# Patient Record
Sex: Female | Born: 1975 | Race: Black or African American | Hispanic: No | Marital: Married | Smoking: Never smoker
Health system: Southern US, Community
[De-identification: ages and names within clinical notes are randomized; demographics above are authoritative.]

## PROBLEM LIST (undated history)

## (undated) ENCOUNTER — Emergency Department (HOSPITAL_COMMUNITY): Disposition: E | Payer: Self-pay | Attending: Family Medicine | Admitting: Family Medicine

## (undated) DIAGNOSIS — I10 Essential (primary) hypertension: Secondary | ICD-10-CM

## (undated) DIAGNOSIS — E785 Hyperlipidemia, unspecified: Secondary | ICD-10-CM

## (undated) DIAGNOSIS — Z789 Other specified health status: Secondary | ICD-10-CM

## (undated) DIAGNOSIS — E119 Type 2 diabetes mellitus without complications: Secondary | ICD-10-CM

## (undated) HISTORY — PX: WISDOM TOOTH EXTRACTION: SHX21

## (undated) HISTORY — DX: Hyperlipidemia, unspecified: E78.5

## (undated) HISTORY — DX: Essential (primary) hypertension: I10

## (undated) DEATH — deceased

---

## 2008-08-18 ENCOUNTER — Emergency Department (HOSPITAL_COMMUNITY): Admission: EM | Admit: 2008-08-18 | Discharge: 2008-08-18 | Payer: Self-pay | Admitting: Family Medicine

## 2008-11-20 ENCOUNTER — Ambulatory Visit (HOSPITAL_COMMUNITY): Admission: RE | Admit: 2008-11-20 | Discharge: 2008-11-20 | Payer: Self-pay | Admitting: Family Medicine

## 2009-02-13 ENCOUNTER — Encounter: Admission: RE | Admit: 2009-02-13 | Discharge: 2009-02-13 | Payer: Self-pay | Admitting: Pulmonary Disease

## 2009-03-28 ENCOUNTER — Inpatient Hospital Stay (HOSPITAL_COMMUNITY): Admission: AD | Admit: 2009-03-28 | Discharge: 2009-03-30 | Payer: Self-pay | Admitting: Family Medicine

## 2009-03-28 ENCOUNTER — Ambulatory Visit: Payer: Self-pay | Admitting: Physician Assistant

## 2010-10-19 ENCOUNTER — Emergency Department (HOSPITAL_COMMUNITY)
Admission: EM | Admit: 2010-10-19 | Discharge: 2010-10-19 | Payer: Self-pay | Source: Home / Self Care | Admitting: Emergency Medicine

## 2010-10-22 ENCOUNTER — Ambulatory Visit (HOSPITAL_COMMUNITY): Admission: RE | Admit: 2010-10-22 | Discharge: 2010-10-22 | Payer: Self-pay | Admitting: Family Medicine

## 2011-01-13 ENCOUNTER — Emergency Department (HOSPITAL_COMMUNITY)
Admission: EM | Admit: 2011-01-13 | Discharge: 2011-01-13 | Disposition: A | Discharge status: Home / Self Care | Payer: Medicaid Other | Attending: Emergency Medicine | Admitting: Emergency Medicine

## 2011-01-13 DIAGNOSIS — R109 Unspecified abdominal pain: Secondary | ICD-10-CM | POA: Insufficient documentation

## 2011-01-13 DIAGNOSIS — N760 Acute vaginitis: Secondary | ICD-10-CM | POA: Insufficient documentation

## 2011-01-13 DIAGNOSIS — R1110 Vomiting, unspecified: Secondary | ICD-10-CM | POA: Insufficient documentation

## 2011-01-13 DIAGNOSIS — A499 Bacterial infection, unspecified: Secondary | ICD-10-CM | POA: Insufficient documentation

## 2011-01-13 DIAGNOSIS — B9689 Other specified bacterial agents as the cause of diseases classified elsewhere: Secondary | ICD-10-CM | POA: Insufficient documentation

## 2011-01-13 DIAGNOSIS — R10819 Abdominal tenderness, unspecified site: Secondary | ICD-10-CM | POA: Insufficient documentation

## 2011-01-13 DIAGNOSIS — R42 Dizziness and giddiness: Secondary | ICD-10-CM | POA: Insufficient documentation

## 2011-01-13 DIAGNOSIS — R51 Headache: Secondary | ICD-10-CM | POA: Insufficient documentation

## 2011-01-13 DIAGNOSIS — J3489 Other specified disorders of nose and nasal sinuses: Secondary | ICD-10-CM | POA: Insufficient documentation

## 2011-01-13 LAB — DIFFERENTIAL
Basophils Relative: 0 % (ref 0–1)
Eosinophils Absolute: 0.1 10*3/uL (ref 0.0–0.7)
Eosinophils Relative: 2 % (ref 0–5)
Neutrophils Relative %: 51 % (ref 43–77)

## 2011-01-13 LAB — WET PREP, GENITAL
Trich, Wet Prep: NONE SEEN
Yeast Wet Prep HPF POC: NONE SEEN

## 2011-01-13 LAB — POCT I-STAT, CHEM 8
Calcium, Ion: 1.34 mmol/L — ABNORMAL HIGH (ref 1.12–1.32)
Glucose, Bld: 92 mg/dL (ref 70–99)
HCT: 37 % (ref 36.0–46.0)
Hemoglobin: 12.6 g/dL (ref 12.0–15.0)
TCO2: 23 mmol/L (ref 0–100)

## 2011-01-13 LAB — CBC
MCV: 77.3 fL — ABNORMAL LOW (ref 78.0–100.0)
Platelets: 222 10*3/uL (ref 150–400)
RBC: 4.53 MIL/uL (ref 3.87–5.11)
RDW: 14.7 % (ref 11.5–15.5)
WBC: 4.7 10*3/uL (ref 4.0–10.5)

## 2011-01-13 LAB — URINALYSIS, ROUTINE W REFLEX MICROSCOPIC
Bilirubin Urine: NEGATIVE
Hgb urine dipstick: NEGATIVE
Nitrite: NEGATIVE
Protein, ur: NEGATIVE mg/dL
Specific Gravity, Urine: 1.011 (ref 1.005–1.030)
Urobilinogen, UA: 0.2 mg/dL (ref 0.0–1.0)

## 2011-01-13 LAB — POCT PREGNANCY, URINE: Preg Test, Ur: NEGATIVE

## 2011-01-14 LAB — GC/CHLAMYDIA PROBE AMP, GENITAL
Chlamydia, DNA Probe: NEGATIVE
GC Probe Amp, Genital: NEGATIVE

## 2011-01-14 IMAGING — CR DG CHEST 1V
1 series · 1 of 1 positions shown · non-contrast
Comparison: None

CLINICAL DATA: Positive PPD.  Pregnant.

CHEST - 1 VIEW

[view not recorded]
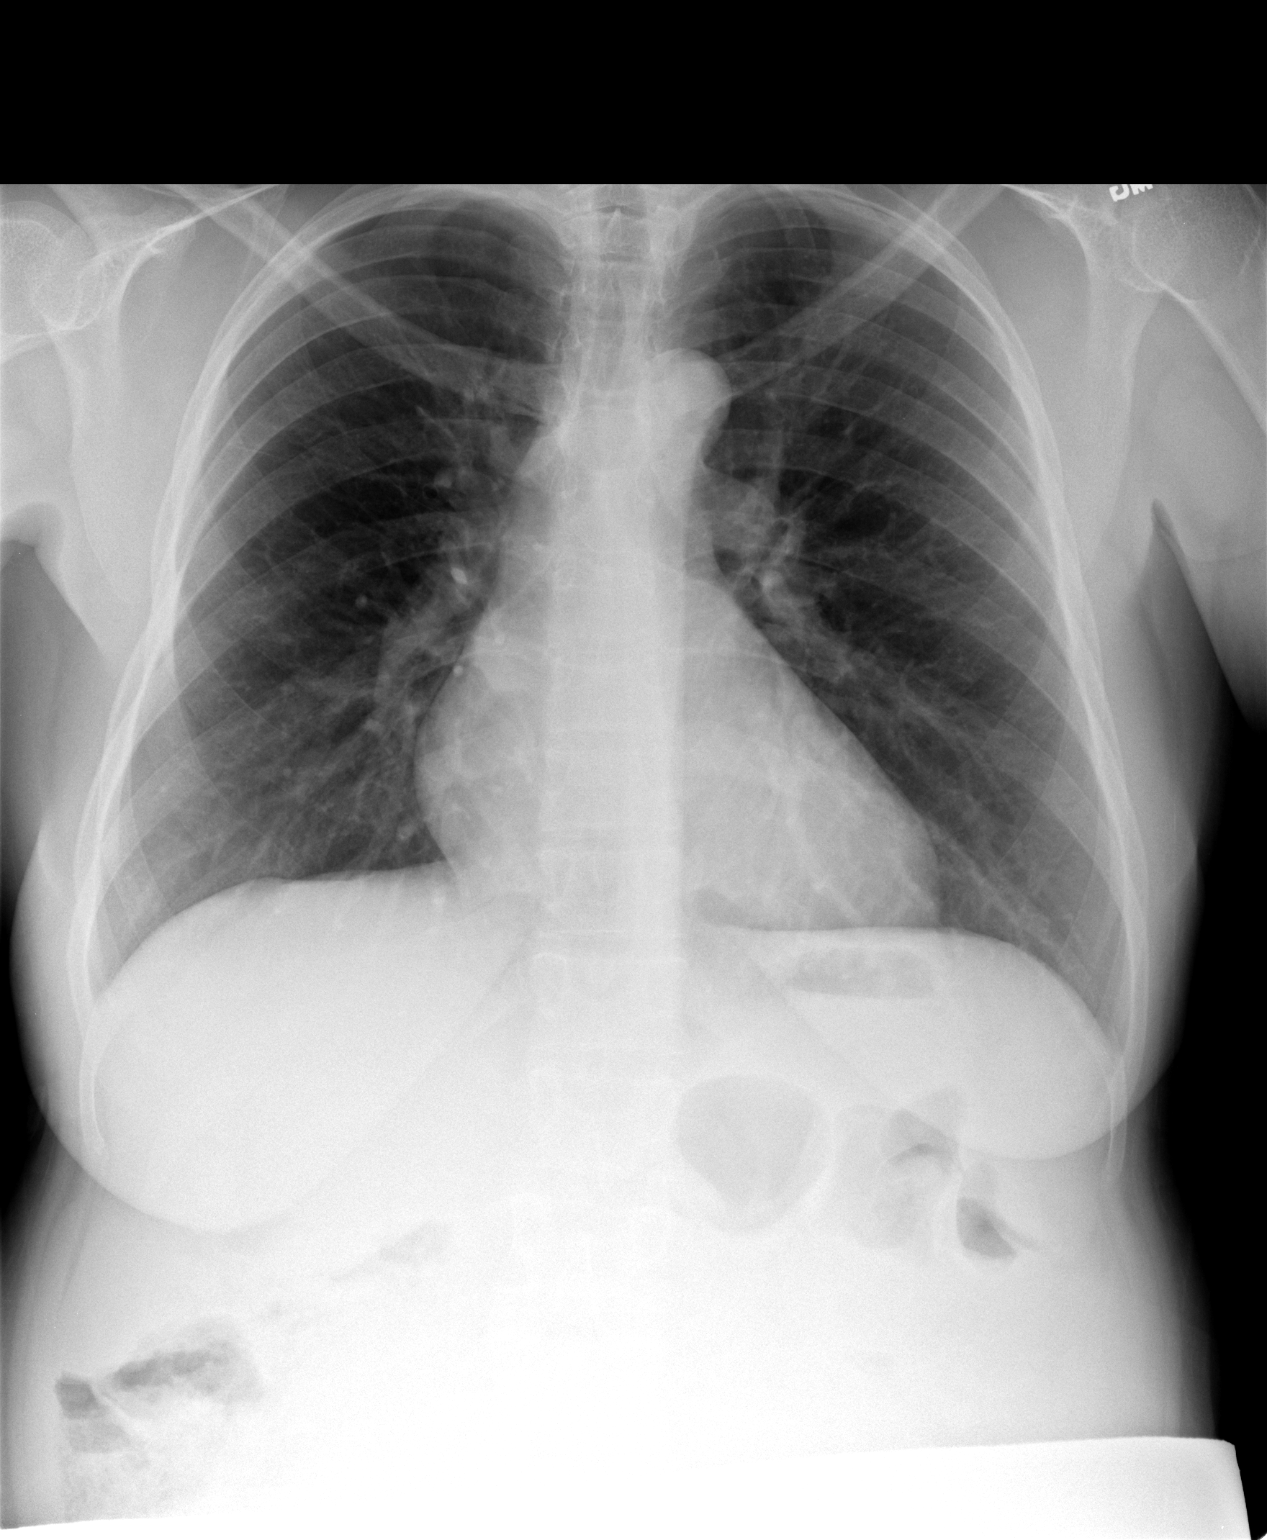

[1 of 1 positions shown; findings below may reference images not displayed]

FINDINGS: The heart size and mediastinal contours are normal.  The
lungs are clear and there is no pleural effusion or pneumothorax.
No acute osseous abnormalities are apparent.  There is a left
cervical rib.
IMPRESSION: 1.  No radiographic evidence of tuberculosis or other acute chest
process.
2.  Left cervical rib - potentially symptomatic.  Correlate
clinically.

## 2011-03-12 LAB — CBC
Hemoglobin: 11.8 g/dL — ABNORMAL LOW (ref 12.0–15.0)
RBC: 3.97 MIL/uL (ref 3.87–5.11)
WBC: 9 10*3/uL (ref 4.0–10.5)

## 2011-03-12 LAB — RPR: RPR Ser Ql: NONREACTIVE

## 2011-03-12 LAB — COMPREHENSIVE METABOLIC PANEL
ALT: 10 U/L (ref 0–35)
AST: 17 U/L (ref 0–37)
Alkaline Phosphatase: 241 U/L — ABNORMAL HIGH (ref 39–117)
CO2: 23 mEq/L (ref 19–32)
Chloride: 107 mEq/L (ref 96–112)
GFR calc Af Amer: >60 mL/min (ref >60)
GFR calc non Af Amer: >60 mL/min (ref >60)
Glucose, Bld: 81 mg/dL (ref 70–99)
Sodium: 135 mEq/L (ref 135–145)
Total Bilirubin: 0.5 mg/dL (ref 0.3–1.2)

## 2011-03-12 LAB — URINALYSIS, DIPSTICK ONLY
Nitrite: NEGATIVE
Protein, ur: NEGATIVE mg/dL
Specific Gravity, Urine: 1.015 (ref 1.005–1.030)
Urobilinogen, UA: 0.2 mg/dL (ref 0.0–1.0)

## 2011-09-01 LAB — POCT URINALYSIS DIP (DEVICE)
Hgb urine dipstick: NEGATIVE
Nitrite: NEGATIVE
Protein, ur: NEGATIVE
pH: 6

## 2011-09-01 LAB — POCT PREGNANCY, URINE: Preg Test, Ur: POSITIVE

## 2011-10-30 ENCOUNTER — Inpatient Hospital Stay (HOSPITAL_COMMUNITY): Payer: Medicaid Other

## 2011-10-30 ENCOUNTER — Inpatient Hospital Stay (HOSPITAL_COMMUNITY)
Admission: AD | Admit: 2011-10-30 | Discharge: 2011-10-30 | Disposition: A | Discharge status: Home / Self Care | Payer: Medicaid Other | Source: Ambulatory Visit | Attending: Obstetrics & Gynecology | Admitting: Obstetrics & Gynecology

## 2011-10-30 ENCOUNTER — Encounter (HOSPITAL_COMMUNITY): Payer: Self-pay | Admitting: *Deleted

## 2011-10-30 DIAGNOSIS — R1032 Left lower quadrant pain: Secondary | ICD-10-CM

## 2011-10-30 DIAGNOSIS — L293 Anogenital pruritus, unspecified: Secondary | ICD-10-CM | POA: Insufficient documentation

## 2011-10-30 DIAGNOSIS — B373 Candidiasis of vulva and vagina: Secondary | ICD-10-CM

## 2011-10-30 DIAGNOSIS — B3731 Acute candidiasis of vulva and vagina: Secondary | ICD-10-CM

## 2011-10-30 DIAGNOSIS — R109 Unspecified abdominal pain: Secondary | ICD-10-CM

## 2011-10-30 HISTORY — DX: Other specified health status: Z78.9

## 2011-10-30 LAB — URINALYSIS, ROUTINE W REFLEX MICROSCOPIC
Ketones, ur: NEGATIVE mg/dL
Nitrite: NEGATIVE
Protein, ur: NEGATIVE mg/dL
pH: 5.5 (ref 5.0–8.0)

## 2011-10-30 LAB — WET PREP, GENITAL: Clue Cells Wet Prep HPF POC: NONE SEEN

## 2011-10-30 LAB — CBC
MCV: 70.9 fL — ABNORMAL LOW (ref 78.0–100.0)
Platelets: 211 10*3/uL (ref 150–400)
RDW: 17.6 % — ABNORMAL HIGH (ref 11.5–15.5)
WBC: 5 10*3/uL (ref 4.0–10.5)

## 2011-10-30 LAB — URINE MICROSCOPIC-ADD ON

## 2011-10-30 MED ORDER — FLUCONAZOLE 150 MG PO TABS
150.0000 mg | ORAL_TABLET | Freq: Once | ORAL | Status: AC
  Administered 2011-10-30: 150 mg via ORAL
  Filled 2011-10-30: qty 1

## 2011-10-30 MED ORDER — KETOROLAC TROMETHAMINE 60 MG/2ML IM SOLN
60.0000 mg | Freq: Once | INTRAMUSCULAR | Status: AC
  Administered 2011-10-30: 60 mg via INTRAMUSCULAR
  Filled 2011-10-30: qty 2

## 2011-10-30 MED ORDER — FLUCONAZOLE 150 MG PO TABS: 150.0000 mg | ORAL_TABLET | Freq: Once | ORAL | Status: AC

## 2011-10-30 NOTE — Progress Notes (Signed)
Patient states she is having a black discharge with an itch.

## 2011-10-31 LAB — GC/CHLAMYDIA PROBE AMP, GENITAL: Chlamydia, DNA Probe: NEGATIVE

## 2011-12-05 ENCOUNTER — Encounter: Payer: Self-pay | Admitting: *Deleted

## 2011-12-05 ENCOUNTER — Emergency Department (INDEPENDENT_AMBULATORY_CARE_PROVIDER_SITE_OTHER)
Admission: EM | Admit: 2011-12-05 | Discharge: 2011-12-05 | Disposition: A | Discharge status: Home / Self Care | Payer: Medicaid Other | Source: Home / Self Care | Attending: Family Medicine | Admitting: Family Medicine

## 2011-12-05 DIAGNOSIS — J020 Streptococcal pharyngitis: Secondary | ICD-10-CM

## 2011-12-05 MED ORDER — AMOXICILLIN 500 MG PO CAPS: 500.0000 mg | ORAL_CAPSULE | Freq: Three times a day (TID) | ORAL | Status: AC

## 2011-12-05 NOTE — ED Notes (Signed)
Pt c/o sorethroat for 3 days with headache.  Obvious distress noted with swallowing.  Throat red and swollen.

## 2011-12-05 NOTE — ED Notes (Signed)
Pt speaks limited [place], son is here helping translate.

## 2011-12-05 NOTE — ED Provider Notes (Signed)
History     CSN: [id number]  Arrival date & time 12/05/11  9563   First MD Initiated Contact with Patient 12/05/11 [phone]      Chief Complaint  Patient presents with  . Sore Throat    (Consider location/radiation/quality/duration/timing/severity/associated sxs/prior treatment) Patient is a 36 y.o. female presenting with pharyngitis. The history is provided by the patient and a relative. The history is limited by a language barrier. Language Interpreter Used: son translated very well.  Sore Throat This is a new problem. The current episode started more than 2 days ago. The problem occurs constantly. The problem has been gradually worsening. The symptoms are aggravated by swallowing.    History reviewed. No pertinent past medical history.  History reviewed. No pertinent past surgical history.  No family history on file.  History  Substance Use Topics  . Smoking status: Never Smoker   . Smokeless tobacco: Not on file  . Alcohol Use: No    OB History    Grav Para Term Preterm Abortions TAB SAB Ect Mult Living                  Review of Systems  Constitutional: Negative.   HENT: Positive for sore throat and trouble swallowing.   Respiratory: Negative.   Gastrointestinal: Negative.   Musculoskeletal: Positive for myalgias.  Skin: Negative.     Allergies  Review of patient's allergies indicates no known allergies.  Home Medications   Current Outpatient Rx  Name Route Sig Dispense Refill  . ACETAMINOPHEN 325 MG PO TABS Oral Take 650 mg by mouth every 6 (six) hours as needed.      . AMOXICILLIN 500 MG PO CAPS Oral Take 1 capsule (500 mg total) by mouth 3 (three) times daily. 30 capsule 0    BP 122/76  Pulse 91  Temp(Src) 99.5 F (37.5 C) (Oral)  Resp 20  SpO2 100%  LMP 12/02/2011  Physical Exam  Nursing note and vitals reviewed. Constitutional: She appears well-developed and well-nourished. She appears distressed.  HENT:  Head: Normocephalic.  Right Ear:  External ear normal.  Left Ear: External ear normal.  Mouth/Throat: Mucous membranes are normal. Oropharyngeal exudate and posterior oropharyngeal erythema present. No tonsillar abscesses.    ED Course  Procedures (including critical care time)  Labs Reviewed  POCT RAPID STREP A ([facility] C[facility] ONLY) - Abnormal; Notable for the following:    Streptococcus, Group A Screen (Direct) POSITIVE (*)    All other components within normal limits   No results found.   1. Streptococcal sore throat       MDM  Strep--  Pos.        Barkley Bruns, MD 12/05/11 [phone]

## 2011-12-05 NOTE — ED Provider Notes (Addendum)
History     CSN: [id number]  Arrival date & time      First MD Initiated Contact with Patient 12/05/11 0824      No chief complaint on file.   (Consider location/radiation/quality/duration/timing/severity/associated sxs/prior treatment) HPI  Past Medical History  Diagnosis Date  . No pertinent past medical history     Past Surgical History  Procedure Date  . Wisdom tooth extraction     Family History  Problem Relation Age of Onset  . Anesthesia problems Neg Hx     History  Substance Use Topics  . Smoking status: Never Smoker   . Smokeless tobacco: Never Used  . Alcohol Use: No    OB History    Grav Para Term Preterm Abortions TAB SAB Ect Mult Living   5 5 4 1  0 0 0 0 0 4      Review of Systems  Allergies  Review of patient's allergies indicates no known allergies.  Home Medications  No current outpatient prescriptions on file.  There were no vitals taken for this visit.  Physical Exam  ED Course  Procedures (including critical care time)  Labs Reviewed - No data to display No results found.   No diagnosis found.    MDM  Strep  pos        Barkley Bruns, MD 12/05/11 1143  Barkley Bruns, MD 01/16/12 [phone]

## 2012-02-04 ENCOUNTER — Encounter (HOSPITAL_COMMUNITY): Payer: Self-pay | Admitting: *Deleted

## 2012-09-19 ENCOUNTER — Emergency Department (HOSPITAL_COMMUNITY)
Admission: EM | Admit: 2012-09-19 | Discharge: 2012-09-20 | Disposition: A | Discharge status: Home / Self Care | Payer: Medicaid Other | Attending: Emergency Medicine | Admitting: Emergency Medicine

## 2012-09-19 ENCOUNTER — Encounter (HOSPITAL_COMMUNITY): Payer: Self-pay | Admitting: Emergency Medicine

## 2012-09-19 ENCOUNTER — Encounter (HOSPITAL_COMMUNITY): Payer: Self-pay | Admitting: Nurse Practitioner

## 2012-09-19 ENCOUNTER — Emergency Department (INDEPENDENT_AMBULATORY_CARE_PROVIDER_SITE_OTHER)
Admission: EM | Admit: 2012-09-19 | Discharge: 2012-09-19 | Disposition: A | Discharge status: Nursing Home (Includes ICF) | Payer: Medicaid Other | Source: Home / Self Care | Attending: Family Medicine | Admitting: Family Medicine

## 2012-09-19 DIAGNOSIS — R109 Unspecified abdominal pain: Secondary | ICD-10-CM

## 2012-09-19 DIAGNOSIS — I498 Other specified cardiac arrhythmias: Secondary | ICD-10-CM | POA: Insufficient documentation

## 2012-09-19 DIAGNOSIS — M549 Dorsalgia, unspecified: Secondary | ICD-10-CM | POA: Insufficient documentation

## 2012-09-19 DIAGNOSIS — R51 Headache: Secondary | ICD-10-CM | POA: Insufficient documentation

## 2012-09-19 DIAGNOSIS — R112 Nausea with vomiting, unspecified: Secondary | ICD-10-CM | POA: Insufficient documentation

## 2012-09-19 DIAGNOSIS — R6883 Chills (without fever): Secondary | ICD-10-CM | POA: Insufficient documentation

## 2012-09-19 LAB — COMPREHENSIVE METABOLIC PANEL
BUN: 12 mg/dL (ref 6–23)
Calcium: 11 mg/dL — ABNORMAL HIGH (ref 8.4–10.5)
Creatinine, Ser: 0.83 mg/dL (ref 0.50–1.10)
GFR calc Af Amer: >90 mL/min (ref >90)
GFR calc non Af Amer: >90 mL/min (ref >90)
Glucose, Bld: 82 mg/dL (ref 70–99)
Sodium: 137 mEq/L (ref 135–145)
Total Protein: 8.4 g/dL — ABNORMAL HIGH (ref 6.0–8.3)

## 2012-09-19 LAB — URINALYSIS, ROUTINE W REFLEX MICROSCOPIC
Leukocytes, UA: NEGATIVE
Nitrite: NEGATIVE
Specific Gravity, Urine: 1.023 (ref 1.005–1.030)
Urobilinogen, UA: 1 mg/dL (ref 0.0–1.0)
pH: 6 (ref 5.0–8.0)

## 2012-09-19 LAB — CBC WITH DIFFERENTIAL/PLATELET
Basophils Relative: 1 % (ref 0–1)
Eosinophils Absolute: 0 10*3/uL (ref 0.0–0.7)
Hemoglobin: 10.3 g/dL — ABNORMAL LOW (ref 12.0–15.0)
Lymphs Abs: 2 10*3/uL (ref 0.7–4.0)
MCH: 20.1 pg — ABNORMAL LOW (ref 26.0–34.0)
MCHC: 30 g/dL (ref 30.0–36.0)
MCV: 66.9 fL — ABNORMAL LOW (ref 78.0–100.0)
Monocytes Absolute: 0.6 10*3/uL (ref 0.1–1.0)
Neutro Abs: 1.7 10*3/uL (ref 1.7–7.7)
Neutrophils Relative %: 39 % — ABNORMAL LOW (ref 43–77)

## 2012-09-19 LAB — POCT URINALYSIS DIP (DEVICE)
Hgb urine dipstick: NEGATIVE
Ketones, ur: NEGATIVE mg/dL
Leukocytes, UA: NEGATIVE
Protein, ur: NEGATIVE mg/dL
pH: 6.5 (ref 5.0–8.0)

## 2012-09-19 MED ORDER — IOHEXOL 300 MG/ML  SOLN
20.0000 mL | INTRAMUSCULAR | Status: AC
  Administered 2012-09-19: 20 mL via ORAL

## 2012-09-19 MED ORDER — SODIUM CHLORIDE 0.9 % IV SOLN
INTRAVENOUS | Status: DC
  Administered 2012-09-19: 125 mL/h via INTRAVENOUS

## 2012-09-19 MED ORDER — KETOROLAC TROMETHAMINE 30 MG/ML IJ SOLN
30.0000 mg | Freq: Once | INTRAMUSCULAR | Status: AC
  Administered 2012-09-19: 30 mg via INTRAVENOUS
  Filled 2012-09-19: qty 1

## 2012-09-19 NOTE — ED Notes (Signed)
Pts heart rate is high 40's low 50's. Pt asymptomatic. RN asked patient if she usually has a low heart rate and pt states she does.

## 2012-09-19 NOTE — ED Provider Notes (Signed)
History     CSN: [id number]  Arrival date & time 09/19/12  1414   First MD Initiated Contact with Patient 09/19/12 1820      Chief Complaint  Patient presents with  . Abdominal Pain    (Consider location/radiation/quality/duration/timing/severity/associated sxs/prior treatment) Patient is a 36 y.o. female presenting with abdominal pain. The history is provided by the patient and the spouse.  Abdominal Pain The primary symptoms of the illness include abdominal pain, nausea and vomiting. The primary symptoms of the illness do not include fever, shortness of breath, diarrhea or dysuria.  Additional symptoms associated with the illness include chills and back pain. Symptoms associated with the illness do not include diaphoresis or hematuria.   36 year old, female, with no significant past medical history presents to emergency department complaining of nausea and vomiting.  X4 yesterday, along with abdominal pain, and back pain.  She also is lightheaded, and has a headache.  She denies fevers, chills, sweating.  She denies cough, or shortness of breath.  She denies diarrhea.  She denies dysuria, or hematuria.  She has not been exposed to anybody with similar symptoms.  Past Medical History  Diagnosis Date  . No pertinent past medical history     Past Surgical History  Procedure Date  . Wisdom tooth extraction     Family History  Problem Relation Age of Onset  . Anesthesia problems Neg Hx     History  Substance Use Topics  . Smoking status: Never Smoker   . Smokeless tobacco: Not on file  . Alcohol Use: No    OB History    Grav Para Term Preterm Abortions TAB SAB Ect Mult Living   5 5 4 1  0 0 0 0 0 4      Review of Systems  Constitutional: Positive for chills. Negative for fever and diaphoresis.  Respiratory: Negative for cough and shortness of breath.   Cardiovascular: Negative for chest pain.  Gastrointestinal: Positive for nausea, vomiting and abdominal pain.  Negative for diarrhea.  Genitourinary: Negative for dysuria and hematuria.  Musculoskeletal: Positive for back pain.  Skin: Negative for rash.  Neurological: Positive for headaches.  Psychiatric/Behavioral: Negative for confusion.  All other systems reviewed and are negative.    Allergies  Review of patient's allergies indicates no known allergies.  Home Medications  No current outpatient prescriptions on file.  BP 112/66  Pulse 49  Temp 98.7 F (37.1 C) (Oral)  Resp 16  SpO2 98%  LMP 09/12/2012  Physical Exam  Nursing note and vitals reviewed. Constitutional: She is oriented to person, place, and time. She appears well-developed and well-nourished. No distress.  HENT:  Head: Normocephalic and atraumatic.  Mouth/Throat: Oropharynx is clear and moist. No oropharyngeal exudate.  Eyes: Conjunctivae normal and EOM are normal.  Neck: Normal range of motion. Neck supple.  Cardiovascular: Regular rhythm and intact distal pulses.   No murmur heard.      bradycardia  Pulmonary/Chest: Effort normal and breath sounds normal. No respiratory distress.  Abdominal: Soft. Bowel sounds are normal. She exhibits no distension. There is tenderness.       Left abd ttp with no peritoneal signs  Musculoskeletal: Normal range of motion. She exhibits no edema.  Neurological: She is alert and oriented to person, place, and time. No cranial nerve deficit.  Skin: Skin is warm and dry.  Psychiatric: She has a normal mood and affect. Thought content normal.    ED Course  Procedures (including critical care time)  Labs  Reviewed  CBC WITH DIFFERENTIAL - Abnormal; Notable for the following:    RBC 5.13 (*)     Hemoglobin 10.3 (*)     HCT 34.3 (*)     MCV 66.9 (*)     MCH 20.1 (*)     RDW 18.4 (*)     Neutrophils Relative 39 (*)     Monocytes Relative 13 (*)     All other components within normal limits  COMPREHENSIVE METABOLIC PANEL - Abnormal; Notable for the following:    Calcium 11.0 (*)      Total Protein 8.4 (*)     Total Bilirubin 0.2 (*)     All other components within normal limits  URINALYSIS, ROUTINE W REFLEX MICROSCOPIC - Abnormal; Notable for the following:    APPearance HAZY (*)     All other components within normal limits   No results found.   No diagnosis found.  ECG Sinus bradycardia at 54 beats per minute. Normal axis. Normal intervals. Nonspecific T-wave changes  MDM  abd pain with chills        Cheri Guppy, MD 09/19/12 1931

## 2012-09-19 NOTE — ED Notes (Signed)
Pt reports yesterday she began feeling cold then got a headache and lower back pain. Then pt vomited X 4 yesterday, no emesis today. Pt denies nausea, SOB and CP. Pt c/o generalized weakness and LLQ tenderness. Pt reports LMP 10/13 and LBM this morning, pt reports it was normal. Pt denies diarrhea.

## 2012-09-19 NOTE — ED Notes (Signed)
Pt ambulated to restroom, steady gait noted. Urine sample obtained. Pt placed back on monitor, side rail up, family at bedside

## 2012-09-19 NOTE — ED Notes (Signed)
Sent from [facility] for further eval abd pain , low back pain, chills, headache since yesterday

## 2012-09-19 NOTE — ED Notes (Signed)
Pt slowing drink CT contrast, informed to finished drinking and let RN know when she is finished.

## 2012-09-19 NOTE — ED Notes (Signed)
Pt and family updated on wait. No needs at this time.

## 2012-09-19 NOTE — ED Provider Notes (Signed)
History     CSN: [id number]  Arrival date & time 09/19/12  1129   None     Chief Complaint  Patient presents with  . Headache  . Back Pain    (Consider location/radiation/quality/duration/timing/severity/associated sxs/prior treatment) Patient is a 36 y.o. female presenting with headaches and back pain. The history is provided by the patient and a friend. The history is limited by a language barrier. A language interpreter was used.  Headache The primary symptoms include headaches, dizziness, nausea and vomiting.  Dizziness also occurs with nausea and vomiting.   Back Pain  Associated symptoms include headaches.  Poor historian.  Low back pain constant associated with chills that began yesterday.  Sudden onset, yesterday 4 episodes of vomiting.  Denies urinary symptoms, no vaginal discharge.  Patient's last menstrual period was 09/12/2012.  Past Medical History  Diagnosis Date  . No pertinent past medical history     Past Surgical History  Procedure Date  . Wisdom tooth extraction     Family History  Problem Relation Age of Onset  . Anesthesia problems Neg Hx     History  Substance Use Topics  . Smoking status: Never Smoker   . Smokeless tobacco: Not on file  . Alcohol Use: No    OB History    Grav Para Term Preterm Abortions TAB SAB Ect Mult Living   5 5 4 1  0 0 0 0 0 4      Review of Systems  Constitutional: Positive for chills and fatigue.  Respiratory: Negative.   Cardiovascular: Negative.   Gastrointestinal: Positive for nausea and vomiting.  Genitourinary: Negative.   Musculoskeletal: Positive for back pain.  Neurological: Positive for dizziness and headaches. Negative for syncope and speech difficulty.    Allergies  Review of patient's allergies indicates no known allergies.  Home Medications   Current Outpatient Rx  Name Route Sig Dispense Refill  . ACETAMINOPHEN 325 MG PO TABS Oral Take 650 mg by mouth every 6 (six) hours as needed.          BP 116/68  Pulse 55  Temp 98.2 F (36.8 C) (Oral)  Resp 17  SpO2 100%  LMP 09/12/2012  Physical Exam  Nursing note and vitals reviewed. Constitutional: She is oriented to person, place, and time. Vital signs are normal. She appears well-developed and well-nourished. She is active and cooperative.  HENT:  Head: Normocephalic.  Right Ear: External ear normal.  Left Ear: External ear normal.  Nose: Nose normal.  Mouth/Throat: Oropharynx is clear and moist. No oropharyngeal exudate.  Eyes: Conjunctivae normal and EOM are normal. Pupils are equal, round, and reactive to light. No scleral icterus.  Neck: Trachea normal and normal range of motion. Neck supple.  Cardiovascular: Normal rate, regular rhythm, normal heart sounds and intact distal pulses.   No murmur heard. Pulmonary/Chest: Effort normal and breath sounds normal.  Abdominal: Soft. Bowel sounds are normal. There is tenderness in the left lower quadrant. There is CVA tenderness. There is no rebound.  Musculoskeletal: Normal range of motion.  Lymphadenopathy:    She has no cervical adenopathy.  Neurological: She is alert and oriented to person, place, and time. She has normal strength. No cranial nerve deficit or sensory deficit. Coordination and gait normal. GCS eye subscore is 4. GCS verbal subscore is 5. GCS motor subscore is 6.  Skin: Skin is warm and dry.  Psychiatric: She has a normal mood and affect. Her speech is normal and behavior is normal. Judgment and  thought content normal. Cognition and memory are normal.    ED Course  Procedures (including critical care time)   Labs Reviewed  POCT URINALYSIS DIP (DEVICE)  POCT PREGNANCY, URINE   No results found. Urine preg-negative Urinalysis wnl  1. Abdominal  pain, other specified site       MDM  Transfer to [facility] for further evaluation and management of abdominal pain.        Johnsie Kindred, NP 09/19/12 1355

## 2012-09-19 NOTE — ED Provider Notes (Signed)
Medical screening examination/treatment/procedure(s) were performed by resident physician or non-physician practitioner and as supervising physician I was immediately available for consultation/collaboration.   Jerrol Helmers DOUGLAS MD.    Nakeesha Bowler D Tanishia Lemaster, MD 09/19/12 1457 

## 2012-09-19 NOTE — ED Notes (Addendum)
Pt c/o headache and dizziness since yesterday and she states that she vomited 4 times and is having cold chills, fatigue.  pt denies diarrhea and fever.  Pt is also c/o lower back pain but denies urinary symptoms.

## 2012-09-20 ENCOUNTER — Emergency Department (HOSPITAL_COMMUNITY): Payer: Medicaid Other

## 2012-09-20 ENCOUNTER — Encounter (HOSPITAL_COMMUNITY): Payer: Self-pay | Admitting: Radiology

## 2012-09-20 MED ORDER — PROMETHAZINE HCL 25 MG PO TABS: 25.0000 mg | ORAL_TABLET | Freq: Four times a day (QID) | ORAL | Status: DC | PRN

## 2012-09-20 MED ORDER — OXYCODONE-ACETAMINOPHEN 5-325 MG PO TABS: 2.0000 | ORAL_TABLET | ORAL | Status: DC | PRN

## 2012-09-20 MED ORDER — IOHEXOL 300 MG/ML  SOLN
100.0000 mL | Freq: Once | INTRAMUSCULAR | Status: AC | PRN
  Administered 2012-09-20: 100 mL via INTRAVENOUS

## 2013-09-29 IMAGING — US US PELVIS COMPLETE
1 series · 14 of 25 positions shown · non-contrast
Comparison: None.

CLINICAL DATA: Right adnexal pain

TRANSABDOMINAL AND TRANSVAGINAL ULTRASOUND OF PELVIS
TECHNIQUE: Both transabdominal and transvaginal ultrasound
examinations of the pelvis were performed. Transabdominal technique
was performed for global imaging of the pelvis including uterus,
ovaries, adnexal regions, and pelvic cul-de-sac.

[Series 1: us pelvis complete · 14 of 55 slices shown]
[im 1/55]
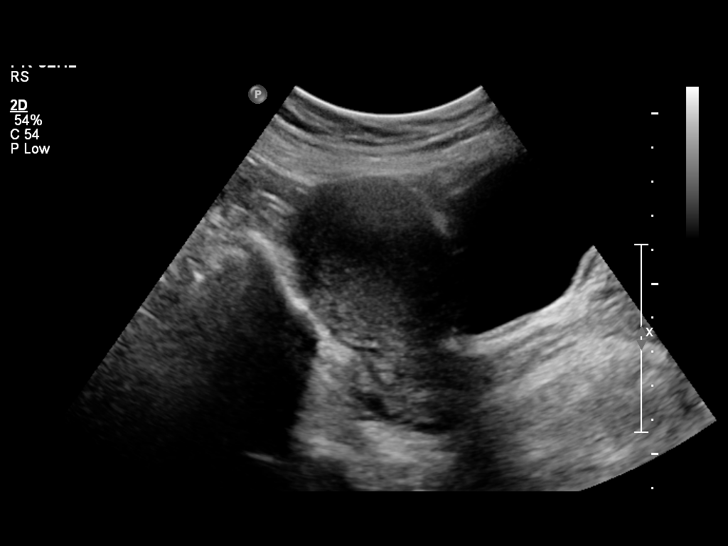
[im 5/55]
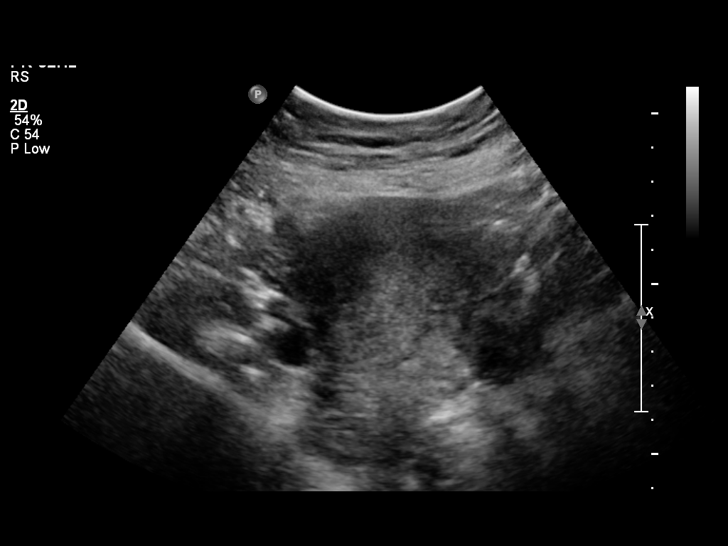
[im 10/55]
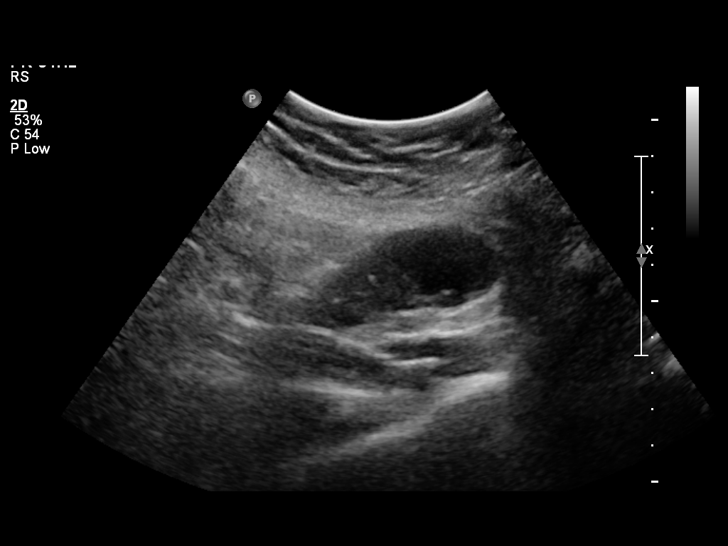
[im 14/55]
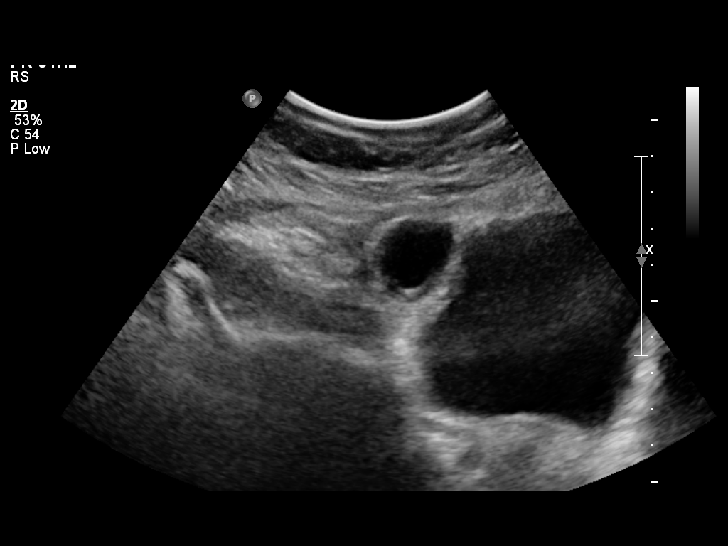
[im 19/55]
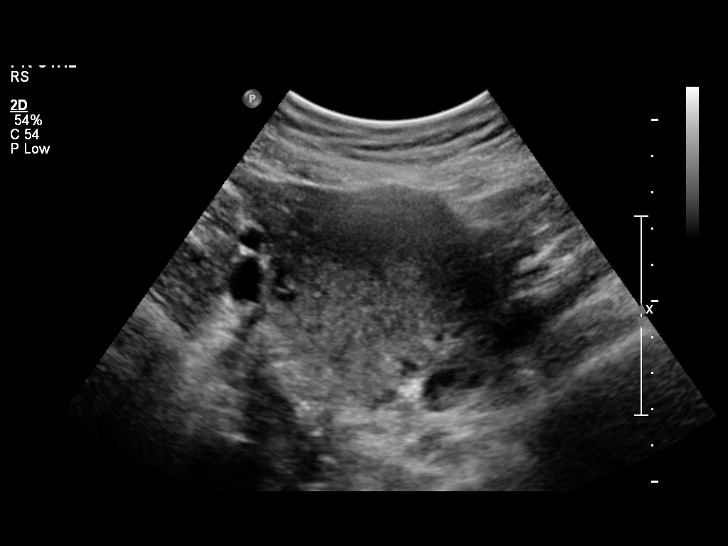
[im 21/55]
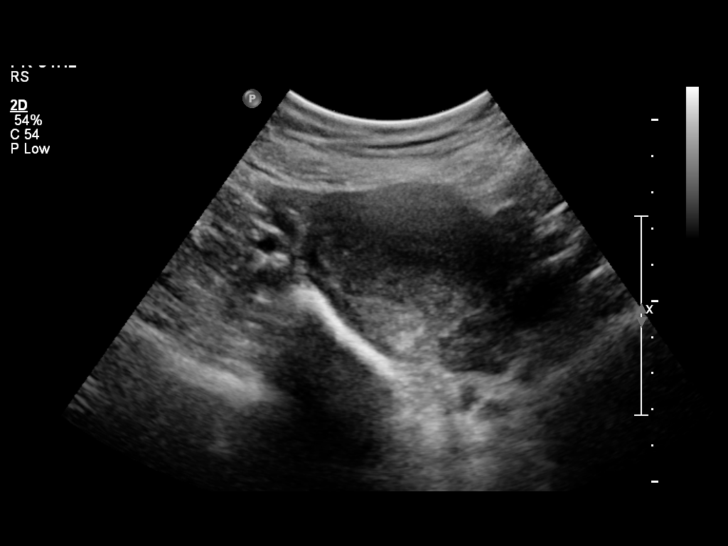
[im 25/55]
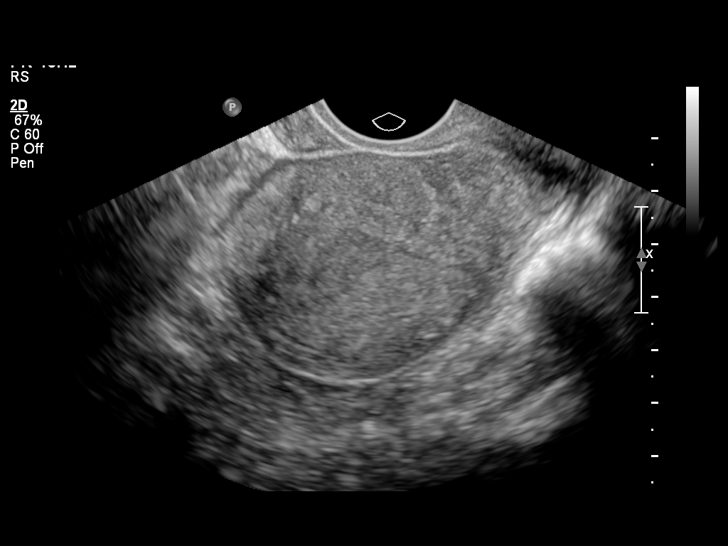
[im 30/55]
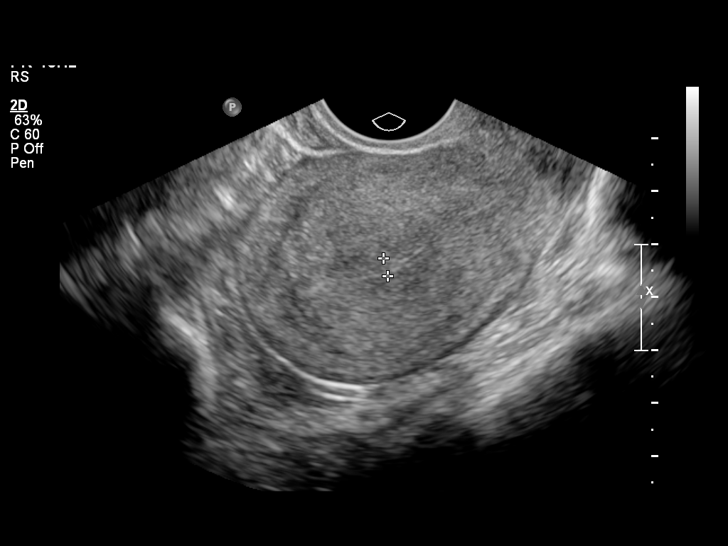
[im 34/55]
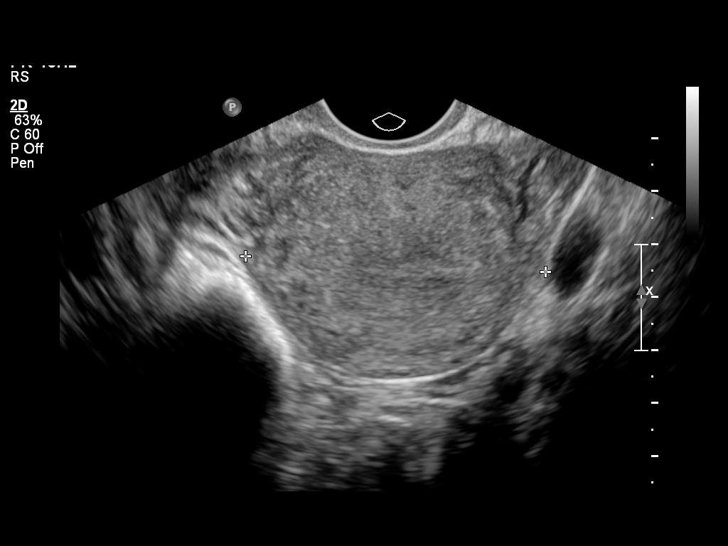
[im 37/55]
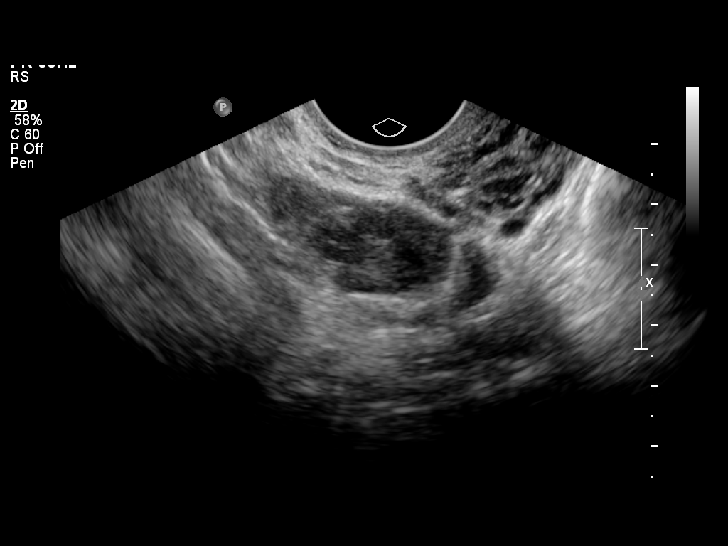
[im 41/55]
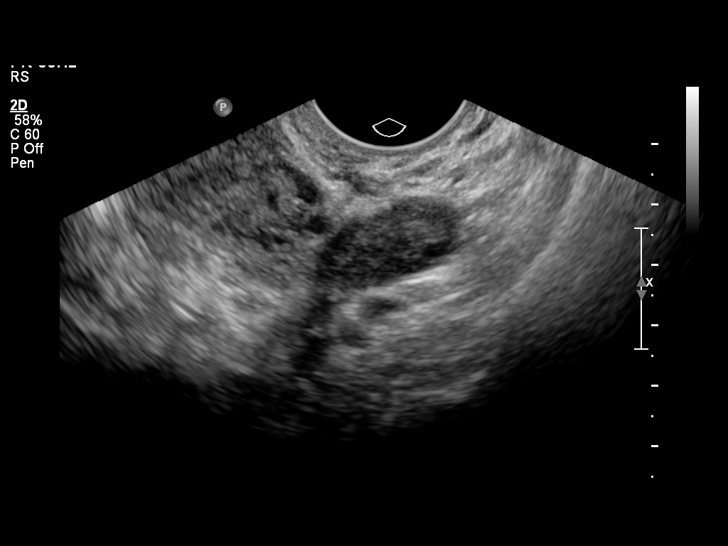
[im 46/55]
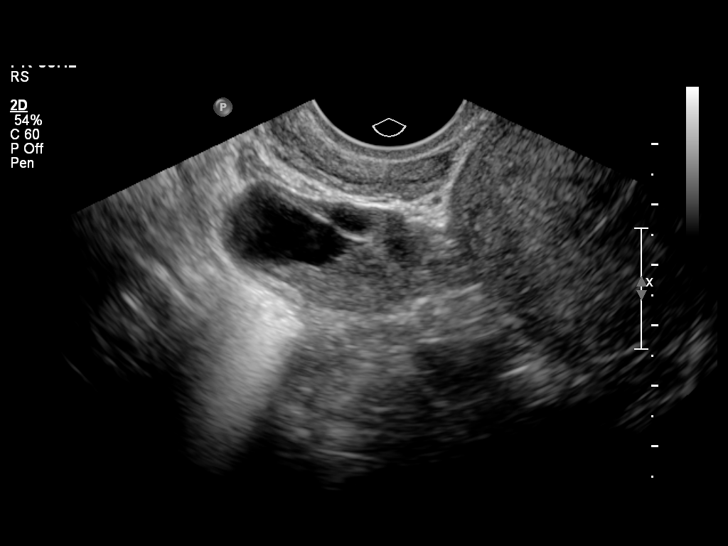
[im 50/55]
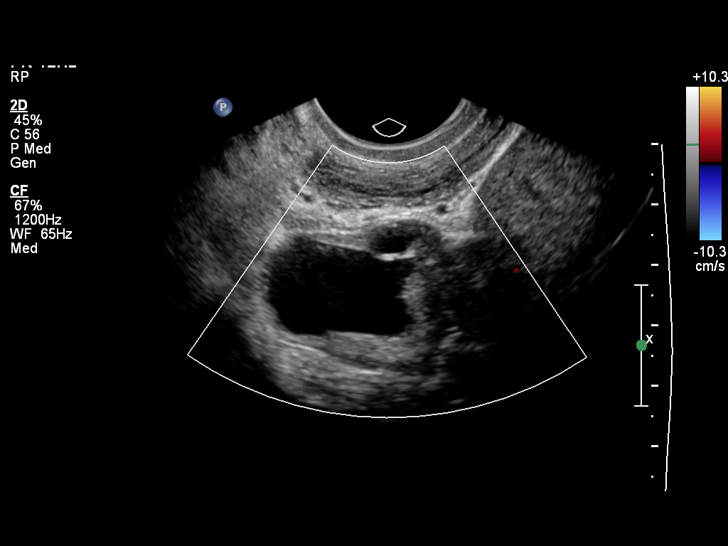
[im 55/55]
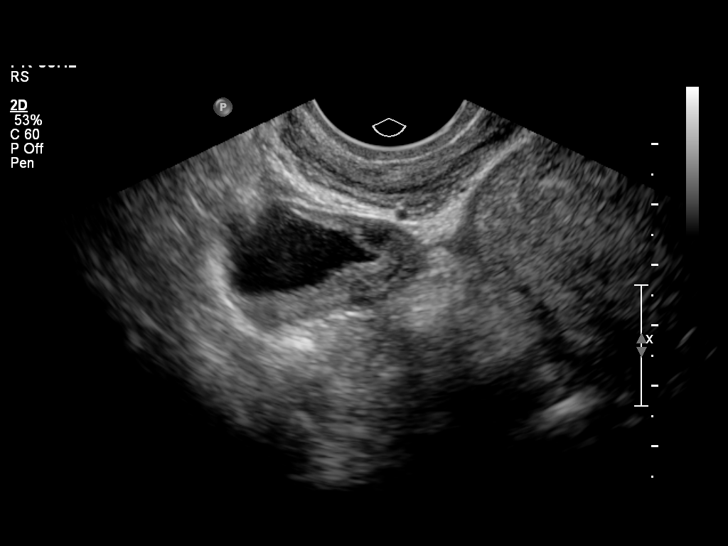

[14 of 25 positions shown; findings below may reference images not displayed]

It was necessary to proceed with endovaginal exam following the
transabdominal exam to visualize the adnexa.
FINDINGS: The uterus is normal in size and echotexture, measuring 9.0 x 4.6 x
5.7 cm.  Endometrial stripe is thin and homogeneous, measuring 4 mm
in width.

Both ovaries have a normal size and appearance.  The left ovary
measures 3.1 x 1.9 x 3.0 cm.  The right ovary measures 3.7 x 2.3 x
2.7 cm and contains a 2.6 cm corpus luteum cyst.  There are no
adnexal masses or free pelvic fluid.
IMPRESSION: Normal pelvic ultrasound.  There is a corpus luteum cyst on the
right ovary.

## 2014-02-01 LAB — OB RESULTS CONSOLE ABO/RH: RH Type: POSITIVE

## 2014-02-01 LAB — OB RESULTS CONSOLE GC/CHLAMYDIA
CHLAMYDIA, DNA PROBE: NEGATIVE
GC PROBE AMP, GENITAL: NEGATIVE

## 2014-02-01 LAB — OB RESULTS CONSOLE ANTIBODY SCREEN: Antibody Screen: NEGATIVE

## 2014-02-01 LAB — OB RESULTS CONSOLE HIV ANTIBODY (ROUTINE TESTING): HIV: NONREACTIVE

## 2014-02-01 LAB — OB RESULTS CONSOLE HEPATITIS B SURFACE ANTIGEN: HEP B S AG: NEGATIVE

## 2014-02-01 LAB — OB RESULTS CONSOLE RUBELLA ANTIBODY, IGM: RUBELLA: IMMUNE

## 2014-07-31 LAB — OB RESULTS CONSOLE GBS: GBS: NEGATIVE

## 2014-08-22 ENCOUNTER — Encounter (HOSPITAL_COMMUNITY): Payer: Self-pay | Admitting: *Deleted

## 2014-08-22 ENCOUNTER — Encounter (HOSPITAL_COMMUNITY)
Admission: AD | Disposition: A | Discharge status: Home / Self Care | Payer: Self-pay | Source: Ambulatory Visit | Attending: Obstetrics and Gynecology

## 2014-08-22 ENCOUNTER — Inpatient Hospital Stay (HOSPITAL_COMMUNITY): Payer: Medicaid Other

## 2014-08-22 ENCOUNTER — Encounter (HOSPITAL_COMMUNITY): Payer: Medicaid Other | Admitting: Anesthesiology

## 2014-08-22 ENCOUNTER — Inpatient Hospital Stay (HOSPITAL_COMMUNITY): Payer: Medicaid Other | Admitting: Anesthesiology

## 2014-08-22 ENCOUNTER — Telehealth (HOSPITAL_COMMUNITY): Payer: Self-pay | Admitting: *Deleted

## 2014-08-22 ENCOUNTER — Inpatient Hospital Stay (HOSPITAL_COMMUNITY)
Admission: AD | Admit: 2014-08-22 | Discharge: 2014-08-25 | DRG: 766 | Disposition: A | Discharge status: Home / Self Care | Payer: Medicaid Other | Source: Ambulatory Visit | Attending: Obstetrics and Gynecology | Admitting: Obstetrics and Gynecology

## 2014-08-22 DIAGNOSIS — Z302 Encounter for sterilization: Secondary | ICD-10-CM

## 2014-08-22 DIAGNOSIS — O328XX0 Maternal care for other malpresentation of fetus, not applicable or unspecified: Secondary | ICD-10-CM | POA: Diagnosis present

## 2014-08-22 DIAGNOSIS — O289 Unspecified abnormal findings on antenatal screening of mother: Secondary | ICD-10-CM

## 2014-08-22 DIAGNOSIS — O479 False labor, unspecified: Secondary | ICD-10-CM | POA: Diagnosis present

## 2014-08-22 HISTORY — PX: TUBAL LIGATION: SHX77

## 2014-08-22 LAB — CBC
HEMATOCRIT: 32.8 % — AB (ref 36.0–46.0)
Hemoglobin: 10.9 g/dL — ABNORMAL LOW (ref 12.0–15.0)
MCH: 27.8 pg (ref 26.0–34.0)
MCHC: 33.2 g/dL (ref 30.0–36.0)
MCV: 83.7 fL (ref 78.0–100.0)
Platelets: 131 10*3/uL — ABNORMAL LOW (ref 150–400)
RBC: 3.92 MIL/uL (ref 3.87–5.11)
RDW: 17 % — ABNORMAL HIGH (ref 11.5–15.5)
WBC: 5.4 10*3/uL (ref 4.0–10.5)

## 2014-08-22 LAB — RPR

## 2014-08-22 LAB — ABO/RH: ABO/RH(D): O POS

## 2014-08-22 LAB — TYPE AND SCREEN
ABO/RH(D): O POS
ANTIBODY SCREEN: NEGATIVE

## 2014-08-22 SURGERY — Surgical Case
Anesthesia: Spinal

## 2014-08-22 MED ORDER — OXYTOCIN 10 UNIT/ML IJ SOLN
INTRAMUSCULAR | Status: AC
  Filled 2014-08-22: qty 4

## 2014-08-22 MED ORDER — PRENATAL MULTIVITAMIN CH
1.0000 | ORAL_TABLET | Freq: Every day | ORAL | Status: DC
  Administered 2014-08-23 – 2014-08-25 (×3): 1 via ORAL
  Filled 2014-08-22 (×3): qty 1

## 2014-08-22 MED ORDER — MORPHINE SULFATE 0.5 MG/ML IJ SOLN
INTRAMUSCULAR | Status: AC
  Filled 2014-08-22: qty 10

## 2014-08-22 MED ORDER — CITRIC ACID-SODIUM CITRATE 334-500 MG/5ML PO SOLN
ORAL | Status: AC
  Administered 2014-08-22: 30 mL
  Filled 2014-08-22: qty 15

## 2014-08-22 MED ORDER — NALBUPHINE HCL 10 MG/ML IJ SOLN: 5.0000 mg | Freq: Once | INTRAMUSCULAR | Status: AC | PRN

## 2014-08-22 MED ORDER — MENTHOL 3 MG MT LOZG: 1.0000 | LOZENGE | OROMUCOSAL | Status: DC | PRN

## 2014-08-22 MED ORDER — KETOROLAC TROMETHAMINE 30 MG/ML IJ SOLN: 30.0000 mg | Freq: Four times a day (QID) | INTRAMUSCULAR | Status: DC | PRN

## 2014-08-22 MED ORDER — ONDANSETRON HCL 4 MG PO TABS: 4.0000 mg | ORAL_TABLET | ORAL | Status: DC | PRN

## 2014-08-22 MED ORDER — ONDANSETRON HCL 4 MG/2ML IJ SOLN: 4.0000 mg | INTRAMUSCULAR | Status: DC | PRN

## 2014-08-22 MED ORDER — ZOLPIDEM TARTRATE 5 MG PO TABS: 5.0000 mg | ORAL_TABLET | Freq: Every evening | ORAL | Status: DC | PRN

## 2014-08-22 MED ORDER — LACTATED RINGERS IV SOLN
INTRAVENOUS | Status: DC
  Administered 2014-08-22: 23:00:00 via INTRAVENOUS
  Administered 2014-08-23: 1 mL via INTRAVENOUS

## 2014-08-22 MED ORDER — NALOXONE HCL 0.4 MG/ML IJ SOLN: 0.4000 mg | INTRAMUSCULAR | Status: DC | PRN

## 2014-08-22 MED ORDER — OXYTOCIN 10 UNIT/ML IJ SOLN
40.0000 [IU] | INTRAVENOUS | Status: DC | PRN
  Administered 2014-08-22: 40 [IU] via INTRAVENOUS

## 2014-08-22 MED ORDER — NALOXONE HCL 1 MG/ML IJ SOLN: 1.0000 ug/kg/h | INTRAVENOUS | Status: DC | PRN

## 2014-08-22 MED ORDER — METOCLOPRAMIDE HCL 5 MG/ML IJ SOLN
INTRAMUSCULAR | Status: AC
  Filled 2014-08-22: qty 2

## 2014-08-22 MED ORDER — DIPHENHYDRAMINE HCL 25 MG PO CAPS
25.0000 mg | ORAL_CAPSULE | ORAL | Status: DC | PRN
  Administered 2014-08-22 – 2014-08-23 (×2): 25 mg via ORAL
  Filled 2014-08-22 (×2): qty 1

## 2014-08-22 MED ORDER — DIBUCAINE 1 % RE OINT: 1.0000 "application " | TOPICAL_OINTMENT | RECTAL | Status: DC | PRN

## 2014-08-22 MED ORDER — SENNOSIDES-DOCUSATE SODIUM 8.6-50 MG PO TABS
2.0000 | ORAL_TABLET | ORAL | Status: DC
  Administered 2014-08-22 – 2014-08-25 (×3): 2 via ORAL
  Filled 2014-08-22 (×3): qty 2

## 2014-08-22 MED ORDER — MEPERIDINE HCL 25 MG/ML IJ SOLN: 6.2500 mg | INTRAMUSCULAR | Status: DC | PRN

## 2014-08-22 MED ORDER — SIMETHICONE 80 MG PO CHEW
80.0000 mg | CHEWABLE_TABLET | Freq: Three times a day (TID) | ORAL | Status: DC
  Administered 2014-08-23 – 2014-08-25 (×6): 80 mg via ORAL
  Filled 2014-08-22 (×7): qty 1

## 2014-08-22 MED ORDER — OXYTOCIN 40 UNITS IN LACTATED RINGERS INFUSION - SIMPLE MED: 62.5000 mL/h | INTRAVENOUS | Status: AC

## 2014-08-22 MED ORDER — METOCLOPRAMIDE HCL 5 MG/ML IJ SOLN: 10.0000 mg | Freq: Once | INTRAMUSCULAR | Status: DC

## 2014-08-22 MED ORDER — TETANUS-DIPHTH-ACELL PERTUSSIS 5-2.5-18.5 LF-MCG/0.5 IM SUSP: 0.5000 mL | Freq: Once | INTRAMUSCULAR | Status: DC

## 2014-08-22 MED ORDER — SCOPOLAMINE 1 MG/3DAYS TD PT72
MEDICATED_PATCH | TRANSDERMAL | Status: AC
  Filled 2014-08-22: qty 1

## 2014-08-22 MED ORDER — IBUPROFEN 600 MG PO TABS
600.0000 mg | ORAL_TABLET | Freq: Four times a day (QID) | ORAL | Status: DC
  Administered 2014-08-23 – 2014-08-25 (×10): 600 mg via ORAL
  Filled 2014-08-22 (×10): qty 1

## 2014-08-22 MED ORDER — ONDANSETRON HCL 4 MG/2ML IJ SOLN
INTRAMUSCULAR | Status: AC
  Filled 2014-08-22: qty 2

## 2014-08-22 MED ORDER — NALBUPHINE HCL 10 MG/ML IJ SOLN
5.0000 mg | INTRAMUSCULAR | Status: DC | PRN
  Administered 2014-08-23 (×2): 5 mg via INTRAVENOUS
  Filled 2014-08-22 (×2): qty 1

## 2014-08-22 MED ORDER — LACTATED RINGERS IV SOLN
INTRAVENOUS | Status: DC | PRN
  Administered 2014-08-22: 18:00:00 via INTRAVENOUS

## 2014-08-22 MED ORDER — CEFAZOLIN SODIUM-DEXTROSE 2-3 GM-% IV SOLR
2.0000 g | INTRAVENOUS | Status: AC
Start: 2014-08-22 — End: 2014-08-22
  Administered 2014-08-22: 2 g via INTRAVENOUS
  Filled 2014-08-22: qty 50

## 2014-08-22 MED ORDER — LANOLIN HYDROUS EX OINT: 1.0000 "application " | TOPICAL_OINTMENT | CUTANEOUS | Status: DC | PRN

## 2014-08-22 MED ORDER — WITCH HAZEL-GLYCERIN EX PADS: 1.0000 "application " | MEDICATED_PAD | CUTANEOUS | Status: DC | PRN

## 2014-08-22 MED ORDER — ONDANSETRON HCL 4 MG/2ML IJ SOLN
INTRAMUSCULAR | Status: DC | PRN
  Administered 2014-08-22: 4 mg via INTRAVENOUS

## 2014-08-22 MED ORDER — KETOROLAC TROMETHAMINE 30 MG/ML IJ SOLN
INTRAMUSCULAR | Status: AC
  Filled 2014-08-22: qty 1

## 2014-08-22 MED ORDER — FENTANYL CITRATE 0.05 MG/ML IJ SOLN
INTRAMUSCULAR | Status: DC | PRN
  Administered 2014-08-22: 25 ug via INTRATHECAL

## 2014-08-22 MED ORDER — DIPHENHYDRAMINE HCL 25 MG PO CAPS: 25.0000 mg | ORAL_CAPSULE | Freq: Four times a day (QID) | ORAL | Status: DC | PRN

## 2014-08-22 MED ORDER — PHENYLEPHRINE HCL 10 MG/ML IJ SOLN
INTRAMUSCULAR | Status: AC
  Filled 2014-08-22: qty 1

## 2014-08-22 MED ORDER — ONDANSETRON HCL 4 MG/2ML IJ SOLN: 4.0000 mg | Freq: Three times a day (TID) | INTRAMUSCULAR | Status: DC | PRN

## 2014-08-22 MED ORDER — SODIUM CHLORIDE 0.9 % IJ SOLN
3.0000 mL | INTRAMUSCULAR | Status: DC | PRN
  Administered 2014-08-23: 3 mL via INTRAVENOUS

## 2014-08-22 MED ORDER — MORPHINE SULFATE (PF) 0.5 MG/ML IJ SOLN
INTRAMUSCULAR | Status: DC | PRN
  Administered 2014-08-22: .1 mg via INTRATHECAL

## 2014-08-22 MED ORDER — SIMETHICONE 80 MG PO CHEW: 80.0000 mg | CHEWABLE_TABLET | ORAL | Status: DC | PRN

## 2014-08-22 MED ORDER — FENTANYL CITRATE 0.05 MG/ML IJ SOLN
INTRAMUSCULAR | Status: AC
  Filled 2014-08-22: qty 2

## 2014-08-22 MED ORDER — OXYCODONE-ACETAMINOPHEN 5-325 MG PO TABS
2.0000 | ORAL_TABLET | ORAL | Status: DC | PRN
Start: 2014-08-22 — End: 2014-08-25
  Administered 2014-08-23 – 2014-08-25 (×6): 2 via ORAL
  Filled 2014-08-22 (×6): qty 2

## 2014-08-22 MED ORDER — SIMETHICONE 80 MG PO CHEW
80.0000 mg | CHEWABLE_TABLET | ORAL | Status: DC
  Administered 2014-08-22 – 2014-08-25 (×3): 80 mg via ORAL
  Filled 2014-08-22 (×3): qty 1

## 2014-08-22 MED ORDER — HYDROMORPHONE HCL 1 MG/ML IJ SOLN: 0.2500 mg | INTRAMUSCULAR | Status: DC | PRN

## 2014-08-22 MED ORDER — SCOPOLAMINE 1 MG/3DAYS TD PT72
1.0000 | MEDICATED_PATCH | Freq: Once | TRANSDERMAL | Status: DC
  Administered 2014-08-22: 1.5 mg via TRANSDERMAL

## 2014-08-22 MED ORDER — KETOROLAC TROMETHAMINE 30 MG/ML IJ SOLN
30.0000 mg | Freq: Four times a day (QID) | INTRAMUSCULAR | Status: DC | PRN
  Administered 2014-08-22: 30 mg via INTRAVENOUS

## 2014-08-22 MED ORDER — LACTATED RINGERS IV SOLN
INTRAVENOUS | Status: DC
  Administered 2014-08-22: 16:00:00 via INTRAVENOUS
  Administered 2014-08-22: 125 mL/h via INTRAVENOUS

## 2014-08-22 MED ORDER — DIPHENHYDRAMINE HCL 50 MG/ML IJ SOLN: 12.5000 mg | INTRAMUSCULAR | Status: DC | PRN

## 2014-08-22 MED ORDER — NALBUPHINE HCL 10 MG/ML IJ SOLN
5.0000 mg | INTRAMUSCULAR | Status: DC | PRN
  Administered 2014-08-23: 11:00:00 via SUBCUTANEOUS
  Filled 2014-08-22: qty 1

## 2014-08-22 MED ORDER — PHENYLEPHRINE 8 MG IN D5W 100 ML (0.08MG/ML) PREMIX OPTIME
INJECTION | INTRAVENOUS | Status: DC | PRN
  Administered 2014-08-22: 60 ug/min via INTRAVENOUS

## 2014-08-22 MED ORDER — FAMOTIDINE IN NACL 20-0.9 MG/50ML-% IV SOLN: 20.0000 mg | Freq: Once | INTRAVENOUS | Status: DC

## 2014-08-22 MED ORDER — OXYCODONE-ACETAMINOPHEN 5-325 MG PO TABS
1.0000 | ORAL_TABLET | ORAL | Status: DC | PRN
  Administered 2014-08-23 – 2014-08-24 (×3): 1 via ORAL
  Filled 2014-08-22 (×3): qty 1

## 2014-08-22 SURGICAL SUPPLY — 37 items
BLADE SURG 10 STRL SS (BLADE) ×8 IMPLANT
CLAMP CORD UMBIL (MISCELLANEOUS) IMPLANT
CLIP FILSHIE TUBAL LIGA STRL (Clip) ×8 IMPLANT
CLOTH BEACON ORANGE TIMEOUT ST (SAFETY) ×4 IMPLANT
COVER LIGHT HANDLE  1/PK (MISCELLANEOUS) ×4
COVER LIGHT HANDLE 1/PK (MISCELLANEOUS) ×4 IMPLANT
DRAPE SHEET LG 3/4 BI-LAMINATE (DRAPES) IMPLANT
DRSG OPSITE POSTOP 4X10 (GAUZE/BANDAGES/DRESSINGS) ×4 IMPLANT
DRSG TELFA 3X8 NADH (GAUZE/BANDAGES/DRESSINGS) IMPLANT
DURAPREP 26ML APPLICATOR (WOUND CARE) ×4 IMPLANT
ELECT REM PT RETURN 9FT ADLT (ELECTROSURGICAL) ×4
ELECTRODE REM PT RTRN 9FT ADLT (ELECTROSURGICAL) ×2 IMPLANT
EXTRACTOR VACUUM M CUP 4 TUBE (SUCTIONS) IMPLANT
EXTRACTOR VACUUM M CUP 4' TUBE (SUCTIONS)
GLOVE BIOGEL PI IND STRL 6.5 (GLOVE) ×2 IMPLANT
GLOVE BIOGEL PI INDICATOR 6.5 (GLOVE) ×2
GLOVE ECLIPSE 6.5 STRL STRAW (GLOVE) ×4 IMPLANT
GOWN STRL REUS W/TWL LRG LVL3 (GOWN DISPOSABLE) ×8 IMPLANT
HEMOSTAT SURGICEL 2X14 (HEMOSTASIS) ×4 IMPLANT
KIT ABG SYR 3ML LUER SLIP (SYRINGE) IMPLANT
NEEDLE HYPO 25X5/8 SAFETYGLIDE (NEEDLE) IMPLANT
NS IRRIG 1000ML POUR BTL (IV SOLUTION) ×4 IMPLANT
PACK C SECTION WH (CUSTOM PROCEDURE TRAY) ×4 IMPLANT
PAD ABD 7.5X8 STRL (GAUZE/BANDAGES/DRESSINGS) IMPLANT
PAD OB MATERNITY 4.3X12.25 (PERSONAL CARE ITEMS) ×4 IMPLANT
RTRCTR C-SECT PINK 25CM LRG (MISCELLANEOUS) ×4 IMPLANT
STAPLER VISISTAT 35W (STAPLE) IMPLANT
SUT MON AB 2-0 CT1 27 (SUTURE) ×4 IMPLANT
SUT MON AB 4-0 PS1 27 (SUTURE) IMPLANT
SUT PDS AB 0 CTX 60 (SUTURE) IMPLANT
SUT PLAIN 2 0 XLH (SUTURE) IMPLANT
SUT VIC AB 0 CTX 36 (SUTURE) ×8
SUT VIC AB 0 CTX36XBRD ANBCTRL (SUTURE) ×8 IMPLANT
SUT VIC AB 4-0 KS 27 (SUTURE) IMPLANT
TOWEL OR 17X24 6PK STRL BLUE (TOWEL DISPOSABLE) ×4 IMPLANT
TRAY FOLEY CATH 14FR (SET/KITS/TRAYS/PACK) ×4 IMPLANT
WATER STERILE IRR 1000ML POUR (IV SOLUTION) ×4 IMPLANT

## 2014-08-22 NOTE — Progress Notes (Signed)
[employer] [phone] used for [place].  Pt consented for bitubal ligation for sterilization.  Pt verbalized understanding of benefits and risks of procedure.

## 2014-08-22 NOTE — MAU Note (Signed)
Having stomach pain since last night. Pain comes and goes. No bleeding or leaking fld.

## 2014-08-22 NOTE — Progress Notes (Signed)
Wende Bushy NP talked with Dr Claiborne Billings on phone to given update as to pt's ctx pattern and reactive FHR. To start IV and draw CBC and will see pt

## 2014-08-22 NOTE — Progress Notes (Signed)
Dr Claiborne Billings notified of CBC results. No new orders.

## 2014-08-22 NOTE — Progress Notes (Signed)
Dr Claiborne Billings notified of pt's admission and status. Aware of reactive fhr with variables on occ. Hx stillbirth x 2. BPP ordered.

## 2014-08-22 NOTE — MAU Note (Signed)
Asked Dr Claiborne Billings if she needed interpreter. States she has talked with couple and husband is fine interpreting and understands need for c/s.

## 2014-08-22 NOTE — Progress Notes (Signed)
After discussing patients OB Ultrasound results with Dr. Claudean Severance, MFM, I spoke with Dr. Claiborne Billings via phone and gave results of funic presentation.  She verbally acknowledged these results.

## 2014-08-22 NOTE — Brief Op Note (Addendum)
08/22/2014  7:18 PM  PATIENT:  Sarah Owens  38 y.o. female  PRE-OPERATIVE DIAGNOSIS:  primary cesarean section for funic presentation  POST-OPERATIVE DIAGNOSIS:  primary cesarean section for funic presentation and bituball igation for sterilization  PROCEDURE:  Procedure(s): CESAREAN SECTION (N/A) bilateral tubal ligation  SURGEON:  Surgeon(s) and Role:    * Philip Aspen, DO - Primary  ANESTHESIA:   spinal  EBL:   see anesthesia report  BLOOD ADMINISTERED:none  DRAINS: none   LOCAL MEDICATIONS USED:  NONE  SPECIMEN:  Source of Specimen:  cord blood  COUNTS:  YES  PLAN OF CARE: Admit to inpatient   PATIENT DISPOSITION:  PACU - hemodynamically stable.  FINDINGS: female infant, funic presentation, with head down, APGARS 8/9, weight pending.  Normal ovaries and tubes bilaterally.  No abdominal/pelvic abnormalities noted.

## 2014-08-22 NOTE — Anesthesia Preprocedure Evaluation (Signed)
Anesthesia Evaluation  Patient identified by MRN, date of birth, ID band Patient awake    Reviewed: Allergy & Precautions, H&P , Patient's Chart, lab work & pertinent test results  Airway Mallampati: II TM Distance: >3 FB Neck ROM: full    Dental no notable dental hx.    Pulmonary  breath sounds clear to auscultation  Pulmonary exam normal       Cardiovascular Exercise Tolerance: Good Rhythm:regular Rate:Normal     Neuro/Psych    GI/Hepatic   Endo/Other    Renal/GU      Musculoskeletal   Abdominal   Peds  Hematology   Anesthesia Other Findings   Reproductive/Obstetrics                           Anesthesia Physical Anesthesia Plan  ASA: II and emergent  Anesthesia Plan: Spinal   Post-op Pain Management:    Induction:   Airway Management Planned:   Additional Equipment:   Intra-op Plan:   Post-operative Plan:   Informed Consent: I have reviewed the patients History and Physical, chart, labs and discussed the procedure including the risks, benefits and alternatives for the proposed anesthesia with the patient or authorized representative who has indicated his/her understanding and acceptance.   Dental Advisory Given  Plan Discussed with: CRNA  Anesthesia Plan Comments: (Lab work confirmed with CRNA in room. Platelets okay. Discussed spinal anesthetic, and patient consents to the procedure:  included risk of possible headache,backache, failed block, allergic reaction, and nerve injury. This patient was asked if she had any questions or concerns before the procedure started. )        Anesthesia Quick Evaluation  

## 2014-08-22 NOTE — Anesthesia Procedure Notes (Signed)
Spinal  Patient location during procedure: OR Preanesthetic Checklist Completed: patient identified, site marked, surgical consent, pre-op evaluation, timeout performed, IV checked, risks and benefits discussed and monitors and equipment checked Spinal Block Patient position: sitting Prep: DuraPrep Patient monitoring: heart rate, cardiac monitor, continuous pulse ox and blood pressure Approach: midline Location: L3-4 Injection technique: single-shot Needle Needle type: Sprotte  Needle gauge: 24 G Needle length: 9 cm Assessment Sensory level: T4 Additional Notes Spinal Dosage in OR  Bupivicaine ml       1.1 PFMS04   mcg        100 Fentanyl mcg            25    

## 2014-08-22 NOTE — MAU Note (Signed)
Lab here for blood draw. Pre op med given. Pt. Taken to OR via bed. IV cont. To infuse.

## 2014-08-22 NOTE — Anesthesia Postprocedure Evaluation (Signed)
  Anesthesia Post Note  Patient: Sarah Owens  Procedure(s) Performed: Procedure(s) (LRB): CESAREAN SECTION (N/A)  Anesthesia type: Spinal  Patient location: PACU  Post pain: Pain level controlled  Post assessment: Post-op Vital signs reviewed  Last Vitals:  Filed Vitals:   08/22/14 2015  BP: 115/63  Pulse:   Temp:   Resp: 23    Post vital signs: Reviewed  Level of consciousness: awake  Complications: No apparent anesthesia complications

## 2014-08-22 NOTE — H&P (Signed)
38 y.o. 105w1d  [id number] comes in c/o contractions.  On exam in MAU presenting part could not be felt.  NST reactive but small variables.  [place] ordered and funic presentation found.  Otherwise has good fetal movement and no bleeding.  Past Medical History  Diagnosis Date  . No pertinent past medical history     Past Surgical History  Procedure Laterality Date  . Wisdom tooth extraction      OB History  Gravida Para Term Preterm AB SAB TAB Ectopic Multiple Living  0 0 0 0 0 0 4    # Outcome Date GA Lbr Len/2nd Weight Sex Delivery Anes PTL Lv  8 CUR           7 TRM      SVD   Y  6 TRM      SVD   Y  5 TRM      SVD   Y  4 TRM      SVD   Y  3 TRM      SVD   N     Comments: died at 7mos  2 TRM      SVD   SB  1 TRM      SVD   SB     Comments: stillborn      History   Social History  . Marital Status: Married    Spouse Name: N/A    Number of Children: N/A  . Years of Education: N/A   Occupational History  . Not on file.   Social History Main Topics  . Smoking status: Never Smoker   . Smokeless tobacco: Not on file  . Alcohol Use: No  . Drug Use: No  . Sexual Activity: Yes   Other Topics Concern  . Not on file   Social History Narrative   ** Merged History Encounter **       Review of patient's allergies indicates no known allergies.    Prenatal Transfer Tool  Maternal Diabetes: No Genetic Screening: Declined Maternal Ultrasounds/Referrals: Normal Fetal Ultrasounds or other Referrals:  Other:  Maternal Substance Abuse:  No Significant Maternal Medications:  None Significant Maternal Lab Results: None  Other PNC: uncomplicated.    Filed Vitals:   08/22/14 1633  BP: 121/59  Pulse: 66  Temp: 98.1 F (36.7 C)  Resp: 18     Lungs/Cor:  NAD Abdomen:  soft, gravid Ex:  no cords, erythema SVE:  4/70/? FHTs:  125, good STV, NST R Toco:  q3-5   A/P   Admit in labor for prime c/s for funic presentation  GBS Neg  Jahiem Franzoni

## 2014-08-22 NOTE — Transfer of Care (Signed)
Immediate Anesthesia Transfer of Care Note  Patient: Sarah Owens  Procedure(s) Performed: Procedure(s): CESAREAN SECTION (N/A)  Patient Location: PACU  Anesthesia Type:Spinal  Level of Consciousness: awake, alert  and oriented  Airway & Oxygen Therapy: Patient Spontanous Breathing  Post-op Assessment: Report given to PACU RN and Post -op Vital signs reviewed and stable  Post vital signs: Reviewed and stable  Complications: No apparent anesthesia complications

## 2014-08-23 ENCOUNTER — Encounter (HOSPITAL_COMMUNITY): Payer: Self-pay | Admitting: Obstetrics and Gynecology

## 2014-08-23 LAB — CBC
HCT: 27.6 % — ABNORMAL LOW (ref 36.0–46.0)
Hemoglobin: 9.1 g/dL — ABNORMAL LOW (ref 12.0–15.0)
MCH: 27.6 pg (ref 26.0–34.0)
MCHC: 33 g/dL (ref 30.0–36.0)
MCV: 83.6 fL (ref 78.0–100.0)
PLATELETS: 121 10*3/uL — AB (ref 150–400)
RBC: 3.3 MIL/uL — AB (ref 3.87–5.11)
RDW: 16.9 % — AB (ref 11.5–15.5)
WBC: 8 10*3/uL (ref 4.0–10.5)

## 2014-08-23 MED ORDER — INFLUENZA VAC SPLIT QUAD 0.5 ML IM SUSY
0.5000 mL | PREFILLED_SYRINGE | INTRAMUSCULAR | Status: AC
  Administered 2014-08-24: 0.5 mL via INTRAMUSCULAR

## 2014-08-23 NOTE — Progress Notes (Signed)
  Patient is eating.  Foley in place, has not ambulated.  Bleeding is appropriate.  Pain control is good.  Filed Vitals:   08/22/14 2348 08/23/14 0148 08/23/14 0300 08/23/14 0604  BP: 108/53 114/65  106/64  Pulse: 63 69  61  Temp: 97.5 F (36.4 C) 98.5 F (36.9 C) 98.2 F (36.8 C) 97.7 F (36.5 C)  TempSrc: Oral Oral Oral Oral  Resp: Height:      Weight:      SpO2: 97% 97% 98%     lungs:   clear to auscultation heart:  RRR Abdomen:  soft, appropriate tenderness, incisions intact and without erythema or exudate ex:    +SCDs, symmetric   Lab Results  Component Value Date   WBC 8.0 08/23/2014   HGB 9.1* 08/23/2014   HCT 27.6* 08/23/2014   MCV 83.6 08/23/2014   PLT 121* 08/23/2014    --/--/O POS (09/22 1743)/R Immune  A/P    Post operative day 1 s/p 1 LTCS/BTL for funic presentation.  Routine post op and postpartum care.  Expect d/c tomorrow.  Desires circumcision, will obtain as outpatient.

## 2014-08-23 NOTE — Anesthesia Postprocedure Evaluation (Signed)
  Anesthesia Post-op Note  Anesthesia Post Note  Patient: Sarah Owens  Procedure(s) Performed: Procedure(s) (LRB): CESAREAN SECTION (N/A)  Anesthesia type: Spinal  Patient location: Mother/Baby  Post pain: Pain level controlled  Post assessment: Post-op Vital signs reviewed  Last Vitals:  Filed Vitals:   08/23/14 0604  BP: 106/64  Pulse: 61  Temp: 36.5 C  Resp: 18    Post vital signs: Reviewed  Level of consciousness: awake  Complications: No apparent anesthesia complications

## 2014-08-23 NOTE — Addendum Note (Signed)
Addendum created 08/23/14 0835 by Turner Daniels, CRNA   Modules edited: Notes Section   Notes Section:  File: [id number]

## 2014-08-23 NOTE — Op Note (Signed)
NAMECATALEAH, Sarah Owens                   ACCOUNT NO.:  [id number]  MEDICAL RECORD NO.:  [id number]  LOCATION:  9131                          FACILITY:  [facility]  PHYSICIAN:  Philip Aspen, DO    DATE OF BIRTH:  03-Nov-1976  DATE OF PROCEDURE:  08/22/2014 DATE OF DISCHARGE:                              OPERATIVE REPORT   PREOPERATIVE DIAGNOSIS:  Labor for primary cesarean section given funic presentation.  POSTOPERATIVE DIAGNOSIS:  Labor for primary cesarean section given funic presentation with bilateral tubal ligation.  PROCEDURE:  Cesarean section with bilateral tubal ligation.  SURGEON:  Philip Aspen, DO  ANESTHESIA:  Spinal.  ESTIMATED BLOOD LOSS:  Please see anesthesia report.  FLUID AND URINE OUTPUT:  Please see anesthesia report.  SPECIMENS:  Cord blood.  FINDINGS:  Female infant with funic presentation, head down.  Normal- appearing ovaries and tubes bilaterally with no other abdominal or pelvic abnormalities noted.  COMPLICATIONS:  None.  CONDITION:  Stable to PACU.  DESCRIPTION OF PROCEDURE:  The patient was taken to the operating room where a spinal anesthesia was administered and found to be adequate. She was then prepped and draped in the normal sterile fashion in dorsal supine position with a leftward tilt.  Pfannenstiel skin incision was made with a scalpel and carried down to the underlying layer of fascia with Bovie cautery.  The fascia was incised at the midline with a scalpel and extended laterally with Mayo scissors.  Superior aspect of the fascial incision was grasped with Kocher clamps, tented, and rectus muscles were dissected off bluntly and sharply.  Kocher clamps were then placed at the inferior aspect of the fascial incision and again rectus muscles were dissected off bluntly and sharply.  The rectus muscles were separated at the midline with hemostats and the peritoneum was entered bluntly.  The peritoneal incision was extended with lateral  traction. The abdomen and pelvis was manually surveyed and no gross abnormalities were found.  The peritoneum was extended sharply with Metzenbaum scissors with good visualization.  Now, the [employer] was placed and the vesicouterine peritoneum was tented, entered sharply with Metzenbaum scissors and then extended laterally.  The bladder flap was developed digitally.  After a low-transverse cesarean incision was made with a scalpel and grasped superior and inferiorly with Allis clamp to tent the myometrium following the amniotic sac.  This was entered with Allis clamps.  The infant's head was located, elevated, and delivered without difficulty, followed by the remainder of the body.  The cord was clamped and cut and the infant was handed off to awaiting Neonatology. The placenta was removed by gentle traction of the umbilical cord and external massage of the uterus.  The uterine incision was closed with 0 Vicryl in a running locked fashion, followed by a second layer of imbrication.  At the superior aspect of the left incision, 1 additional figure-of-eight was placed for hemostasis.  Both tubes were identified, elevated with Babcock and Filshie clips were placed.  With verification, I felt, the clips encompassed the entire tubes and mesosalpinx.  The incision was reexamined and found to be hemostatic.  The Alexis self- retractor was removed.  Peritoneum  was grasped with Kelly clamps and closed with 4-0 Monocryl in a running fashion.  All muscle and fascial surfaces were examined and found to be hemostatic.  Fascia was then closed with Vicryl in a running fashion.  Subcutaneous tissue was irrigated, dried and found to be hemostatic with minimal use of Bovie cautery.  Skin was reapproximated and closed with staples.          ______________________________ Philip Aspen, DO     /MEDQ  D:  08/22/2014  T:  08/23/2014  Job:  [id number]

## 2014-08-23 NOTE — Lactation Note (Signed)
This note was copied from the chart of Sarah Owens. Lactation Consultation Note  Patient Name: Sarah Shandee Quiara Killian Date: 08/23/2014 Leafy Kindle #[id number] through [facility] interpreter on the speaker phone at consult Reason for consult: Initial assessment this baby is 181/2 hours old ,  Mom is an experienced breast feeder with 1-2 years for each baby , it has been 5 years since her last baby. Per mom active with [facility].and will be going back to work . LC suggested calling WIC for a DEBP at 3 weeks To prepare for transition to work. Baby awake and rooting , LC changed a large wet and mec green stool diaper at consult.lLOC handed  Baby to mom and mom independently latched the Baby on the right breast for 10 mins , and then she switched to the left breast in cradle position. Discussed with mom importance of depth at the breast to enhance milk flow. LC showed mom hand expressed , steady flow of colostrum noted. Per mom per interpreter , did not have any breast feeding questions because she is experience.  Mother informed of post-discharge support and given phone number to the lactation department, including services for phone call assistance;  out-patient appointments; and breastfeeding support group. List of other breastfeeding resources in the community given in the handout.  Encouraged mother to call for problems or concerns related to breastfeeding.    Maternal Data Formula Feeding for Exclusion: No Has patient been taught Hand Expression?: Yes Does the patient have breastfeeding experience prior to this delivery?: Yes  Feeding Feeding Type: Breast Fed Length of feed: 10 min (right breast )  LATCH Score/Interventions Latch: Grasps breast easily, tongue down, lips flanged, rhythmical sucking. Intervention(s): Breast massage;Breast compression  Audible Swallowing: A few with stimulation  Type of Nipple: Everted at rest and after stimulation  Comfort (Breast/Nipple): Soft /  non-tender     Hold (Positioning): No assistance needed to correctly position infant at breast. Intervention(s): Breastfeeding basics reviewed;Support Pillows;Position options;Skin to skin  LATCH Score: 9  Lactation Tools Discussed/Used WIC Program: Yes (per mom [place] )   Consult Status Consult Status: PRN Date: 08/24/14 Follow-up type: In-patient    Kathrin Greathouse 08/23/2014, 2:18 PM

## 2014-08-23 NOTE — Progress Notes (Signed)
Ur chart review completed.  

## 2014-08-24 LAB — BIRTH TISSUE RECOVERY COLLECTION (PLACENTA DONATION)

## 2014-08-24 NOTE — Progress Notes (Signed)
Subjective: Postpartum Day 2: Cesarean Delivery Patient reports pain controlled, no nausea or vomiting, notes abdominal gas but is passing small amounts of flatus  Objective: Vital signs in last 24 hours: Temp:  [98 F (36.7 C)-98.4 F (36.9 C)] 98 F (36.7 C) (09/24 9147) Pulse Rate:  [58-71] 71 (09/24 0614) Resp:  [16] 16 (09/24 0614) BP: (93-110)/(48-61) 93/48 mmHg (09/24 0614) SpO2:  [97 %-100 %] 100 % (09/23 1853)  Physical Exam:  General: alert, cooperative and appears stated age Lochia: appropriate Uterine Fundus: firm, abd distended Incision: healing well DVT Evaluation: No evidence of DVT seen on physical exam.   Recent Labs  08/22/14 1615 08/23/14 0655  HGB 10.9* 9.1*  HCT 32.8* 27.6*    Assessment/Plan: Status post Cesarean section. Doing well postoperatively.  Continue current care. Consider D/C home tomorrow  Sarah Owens H. 08/24/2014, 10:38 AM

## 2014-08-25 MED ORDER — FERROUS SULFATE 325 (65 FE) MG PO TABS: 325.0000 mg | ORAL_TABLET | Freq: Two times a day (BID) | ORAL | Status: DC

## 2014-08-25 MED ORDER — IBUPROFEN 600 MG PO TABS: 600.0000 mg | ORAL_TABLET | Freq: Four times a day (QID) | ORAL | Status: DC | PRN

## 2014-08-25 MED ORDER — SENNOSIDES-DOCUSATE SODIUM 8.6-50 MG PO TABS: 2.0000 | ORAL_TABLET | Freq: Every evening | ORAL | Status: DC | PRN

## 2014-08-25 MED ORDER — OXYCODONE-ACETAMINOPHEN 5-325 MG PO TABS: 1.0000 | ORAL_TABLET | ORAL | Status: DC | PRN

## 2014-08-25 NOTE — Progress Notes (Signed)
Subjective: Postpartum Day 3: Cesarean Delivery Patient reports incisional pain, tolerating PO, + flatus, + BM and no problems voiding.   She notes her pain is well controlled. No fever, chills, chest pain or shortness of breath.   Objective: Vital signs in last 24 hours: Temp:  [98.4 F (36.9 C)] 98.4 F (36.9 C) (09/25 0606) Pulse Rate:  [67-69] 67 (09/25 0606) Resp:  [17] 17 (09/25 0606) BP: (101-121)/(57-70) 121/70 mmHg (09/25 0606) SpO2:  [99 %-100 %] 99 % (09/25 0606)  Physical Exam:  General: alert, cooperative and no distress Lochia: appropriate Uterine Fundus: firm Incision: healing well, no significant drainage, no dehiscence, no significant erythema DVT Evaluation: No evidence of DVT seen on physical exam. Negative Homan's sign. No cords or calf tenderness.   Recent Labs  08/22/14 1615 08/23/14 0655  HGB 10.9* 9.1*  HCT 32.8* 27.6*    Assessment/Plan: Status post Cesarean section. Doing well postoperatively.  Discharge home with standard precautions and return to clinic in 4-6 weeks.  Sarah Owens 08/25/2014, 8:53 AM

## 2014-08-25 NOTE — Discharge Summary (Signed)
Obstetric Discharge Summary Reason for Admission: onset of labor Prenatal Procedures: ultrasound Intrapartum Procedures: cesarean: low cervical, transverse Postpartum Procedures: none Complications-Operative and Postpartum: none Hemoglobin  Date Value Ref Range Status  08/23/2014 9.1* 12.0 - 15.0 g/dL Final     HCT  Date Value Ref Range Status  08/23/2014 27.6* 36.0 - 46.0 % Final    Physical Exam:  General: alert, cooperative and no distress Lochia: appropriate Uterine Fundus: firm Incision: healing well, no significant drainage, no dehiscence, no significant erythema DVT Evaluation: No evidence of DVT seen on physical exam. Negative Homan's sign. No cords or calf tenderness.  Discharge Diagnoses: Term Pregnancy-delivered  Discharge Information: Date: 08/25/2014 Activity: pelvic rest Diet: routine Medications: Ibuprofen, Colace, Iron and Percocet Condition: stable Instructions: refer to practice specific booklet Discharge to: home   Newborn Data: Live born female  Birth Weight: 7 lb 4.9 oz (3315 g) APGAR: 8, 9  Home with mother.  Sarah Owens 08/25/2014, 8:58 AM

## 2014-08-28 ENCOUNTER — Inpatient Hospital Stay (HOSPITAL_COMMUNITY): Admission: RE | Admit: 2014-08-28 | Payer: Medicaid Other | Source: Ambulatory Visit

## 2014-09-05 ENCOUNTER — Encounter (HOSPITAL_COMMUNITY): Payer: Self-pay | Admitting: *Deleted

## 2014-09-05 ENCOUNTER — Inpatient Hospital Stay (HOSPITAL_COMMUNITY)
Admission: AD | Admit: 2014-09-05 | Discharge: 2014-09-05 | Disposition: A | Discharge status: Home / Self Care | Payer: Medicaid Other | Source: Ambulatory Visit | Attending: Obstetrics & Gynecology | Admitting: Obstetrics & Gynecology

## 2014-09-05 ENCOUNTER — Inpatient Hospital Stay (HOSPITAL_COMMUNITY): Payer: Medicaid Other

## 2014-09-05 ENCOUNTER — Other Ambulatory Visit (HOSPITAL_COMMUNITY): Payer: Self-pay | Admitting: Obstetrics & Gynecology

## 2014-09-05 DIAGNOSIS — K5900 Constipation, unspecified: Secondary | ICD-10-CM | POA: Diagnosis not present

## 2014-09-05 DIAGNOSIS — O9989 Other specified diseases and conditions complicating pregnancy, childbirth and the puerperium: Secondary | ICD-10-CM | POA: Insufficient documentation

## 2014-09-05 DIAGNOSIS — R112 Nausea with vomiting, unspecified: Secondary | ICD-10-CM | POA: Insufficient documentation

## 2014-09-05 DIAGNOSIS — R109 Unspecified abdominal pain: Secondary | ICD-10-CM | POA: Diagnosis not present

## 2014-09-05 LAB — COMPREHENSIVE METABOLIC PANEL
ALK PHOS: 183 U/L — AB (ref 39–117)
ALT: 12 U/L (ref 0–35)
ANION GAP: 12 (ref 5–15)
AST: 18 U/L (ref 0–37)
Albumin: 3.1 g/dL — ABNORMAL LOW (ref 3.5–5.2)
BUN: 14 mg/dL (ref 6–23)
CHLORIDE: 106 meq/L (ref 96–112)
CO2: 20 mEq/L (ref 19–32)
Calcium: 9.7 mg/dL (ref 8.4–10.5)
Creatinine, Ser: 0.75 mg/dL (ref 0.50–1.10)
GFR calc non Af Amer: >90 mL/min (ref >90)
GLUCOSE: 70 mg/dL (ref 70–99)
POTASSIUM: 4.7 meq/L (ref 3.7–5.3)
SODIUM: 138 meq/L (ref 137–147)
TOTAL PROTEIN: 7 g/dL (ref 6.0–8.3)

## 2014-09-05 LAB — CBC WITH DIFFERENTIAL/PLATELET
Basophils Absolute: 0 10*3/uL (ref 0.0–0.1)
Basophils Relative: 0 % (ref 0–1)
Eosinophils Absolute: 0.1 10*3/uL (ref 0.0–0.7)
Eosinophils Relative: 1 % (ref 0–5)
HCT: 31.6 % — ABNORMAL LOW (ref 36.0–46.0)
Hemoglobin: 10.1 g/dL — ABNORMAL LOW (ref 12.0–15.0)
LYMPHS PCT: 21 % (ref 12–46)
Lymphs Abs: 1.2 10*3/uL (ref 0.7–4.0)
MCH: 27.4 pg (ref 26.0–34.0)
MCHC: 32 g/dL (ref 30.0–36.0)
MCV: 85.6 fL (ref 78.0–100.0)
MONOS PCT: 6 % (ref 3–12)
Monocytes Absolute: 0.4 10*3/uL (ref 0.1–1.0)
NEUTROS ABS: 4.3 10*3/uL (ref 1.7–7.7)
NEUTROS PCT: 72 % (ref 43–77)
PLATELETS: 243 10*3/uL (ref 150–400)
RBC: 3.69 MIL/uL — AB (ref 3.87–5.11)
RDW: 17 % — ABNORMAL HIGH (ref 11.5–15.5)
WBC: 6 10*3/uL (ref 4.0–10.5)

## 2014-09-05 LAB — URINE MICROSCOPIC-ADD ON

## 2014-09-05 LAB — URINALYSIS, ROUTINE W REFLEX MICROSCOPIC
Bilirubin Urine: NEGATIVE
Glucose, UA: NEGATIVE mg/dL
KETONES UR: NEGATIVE mg/dL
Nitrite: NEGATIVE
PH: 5.5 (ref 5.0–8.0)
PROTEIN: NEGATIVE mg/dL
Specific Gravity, Urine: <1.005 — ABNORMAL LOW (ref 1.005–1.030)
Urobilinogen, UA: 0.2 mg/dL (ref 0.0–1.0)

## 2014-09-05 LAB — LACTIC ACID, PLASMA: Lactic Acid, Venous: 0.7 mmol/L (ref 0.5–2.2)

## 2014-09-05 LAB — LIPASE, BLOOD: Lipase: 24 U/L (ref 11–59)

## 2014-09-05 MED ORDER — IOHEXOL 300 MG/ML  SOLN
100.0000 mL | Freq: Once | INTRAMUSCULAR | Status: AC | PRN
  Administered 2014-09-05: 100 mL via INTRAVENOUS

## 2014-09-05 MED ORDER — IOHEXOL 300 MG/ML  SOLN
50.0000 mL | INTRAMUSCULAR | Status: AC
  Administered 2014-09-05 (×2): 50 mL via ORAL

## 2014-09-05 MED ORDER — ONDANSETRON HCL 4 MG/2ML IJ SOLN
4.0000 mg | Freq: Once | INTRAMUSCULAR | Status: AC
  Administered 2014-09-05: 4 mg via INTRAVENOUS
  Filled 2014-09-05: qty 2

## 2014-09-05 MED ORDER — MAGNESIUM HYDROXIDE 400 MG/5ML PO SUSP: 30.0000 mL | Freq: Every day | ORAL | Status: DC | PRN

## 2014-09-05 NOTE — MAU Provider Note (Signed)
History     CSN: [id number]  Arrival date and time: 09/05/14 1634   None     No chief complaint on file.  HPI 38 you [id number], s/p PLTCS 2 weeks ago, presents with nausea and vomiting.  States that has vomited with most every meal since her recent hospital discharge. Today the vomiting was stronger and more frequent and so decided to present for evaluation. Has some abdominal cramping with vomiting but otherwise no abdominal pain. Was initially constipated when returned home, then had 2 days of loose stools, and now has not defecated for 3-4 days. Is passing flatus, including today. Vomitus is non-bloody, non-bilious. Vomits pretty much every time she eats. No fevers or chills. No dysuria, urinary frequency, or hematuria. Was seen and evaluated in clinic by her obstetric provider and sent to this hospital for further w/u including CT.   Past Medical History  Diagnosis Date  . No pertinent past medical history     Past Surgical History  Procedure Laterality Date  . Wisdom tooth extraction    . Cesarean section N/A 08/22/2014    Procedure: CESAREAN SECTION;  Surgeon: Philip Aspen, DO;  Location: [facility] ORS;  Service: Obstetrics;  Laterality: N/A;  . Tubal ligation Bilateral 08/22/2014    Procedure: BILATERAL TUBAL LIGATION;  Surgeon: Philip Aspen, DO;  Location: [facility];  Service: Obstetrics;  Laterality: Bilateral;    Family History  Problem Relation Age of Onset  . Anesthesia problems Neg Hx   . Alcohol abuse Neg Hx     History  Substance Use Topics  . Smoking status: Never Smoker   . Smokeless tobacco: Not on file  . Alcohol Use: No    Allergies: No Known Allergies  Prescriptions prior to admission  Medication Sig Dispense Refill  . ferrous sulfate 325 (65 FE) MG tablet Take 1 tablet (325 mg total) by mouth 2 (two) times daily with a meal.  60 tablet  3  . ibuprofen (ADVIL,MOTRIN) 600 MG tablet Take 1 tablet (600 mg total) by mouth every 6 (six) hours as needed for  mild pain.  60 tablet  3  . oxyCODONE-acetaminophen (PERCOCET/ROXICET) 5-325 MG per tablet Take 2 tablets by mouth every 4 (four) hours as needed for pain.  6 tablet  0  . oxyCODONE-acetaminophen (PERCOCET/ROXICET) 5-325 MG per tablet Take 1-2 tablets by mouth every 4 (four) hours as needed for severe pain (for pain scale less than 7).  60 tablet  0  . Prenatal Vit-Fe Fumarate-FA (PRENATAL MULTIVITAMIN) TABS tablet Take 1 tablet by mouth daily at 12 noon.      . senna-docusate (SENOKOT-S) 8.6-50 MG per tablet Take 2 tablets by mouth at bedtime as needed for mild constipation.  60 tablet  3    Review of Systems  Constitutional: Negative for fever and chills.  HENT: Negative for hearing loss.   Respiratory: Negative for cough.   Cardiovascular: Negative for chest pain.  Gastrointestinal: Positive for nausea, vomiting, abdominal pain and constipation. Negative for heartburn and blood in stool.  Genitourinary: Negative for dysuria, urgency, frequency, hematuria and flank pain.  Musculoskeletal: Negative for myalgias.  Skin: Negative for rash.   Physical Exam   Blood pressure 153/91, pulse 52, temperature 98.3 F (36.8 C), temperature source Oral, resp. rate 18, unknown if currently breastfeeding.  Physical Exam  Constitutional: She appears well-developed and well-nourished. No distress.  HENT:  Head: Normocephalic and atraumatic.  Eyes: Conjunctivae are normal.  Cardiovascular: Normal rate, regular rhythm and normal heart sounds.  Respiratory: Effort normal and breath sounds normal.  GI: Soft. She exhibits no distension. There is no rebound and no guarding.  Mild ttp around cesarean incision site, which is c/d/i and well-approximate without surrounding erythema. Nodule of induration at right lateral incision margin.   Neurological: She is alert.  Skin: Skin is warm and dry.  Psychiatric: She has a normal mood and affect.    MAU Course  Procedures  Ct Abdomen Pelvis W  Contrast  09/05/2014   CLINICAL DATA:  Initial encounter for low abdominal pain with nausea and vomiting. Status post C-section 08/22/2014. Constipation. Postprandial emesis.  EXAM: CT ABDOMEN AND PELVIS WITH CONTRAST  TECHNIQUE: Multidetector CT imaging of the abdomen and pelvis was performed using the standard protocol following bolus administration of intravenous contrast.  CONTRAST:  OMNIPAQUE IOHEXOL 300 MG/ML  SOLN  COMPARISON:  CT abdomen and pelvis 09/20/2012.  FINDINGS: A small amount of pleural fluid is present bilaterally. There is mild dependent atelectasis. The heart size is normal.  The liver and spleen are within normal limits. Stomach the decompressed. The duodenum and pancreas are within normal limits. The study is mildly degraded by patient motion. The common bile duct and gallbladder are normal. The adrenal glands are normal bilaterally. Kidneys and ureters are within normal limits.  Moderate stool is present in the rectosigmoid colon. The colon extends above an enlarged edematous uterus, compatible with recent pregnancy. The remainder the colon is unremarkable. A surgical clips suggest prior appendectomy. The small bowel is decompressed and unremarkable.  In uterus is enlarged and edematous compatible with recent delivery. A 2.8 x 2.3 cm benign-appearing fluid collection is noted within the incision on the right. There is minimal fluid within the rectus musculature on the left.  The bone windows demonstrate no focal lytic or blastic lesions.  IMPRESSION: 1. Moderate stool throughout the colon without evidence for obstruction. 2. Sequela of recent C-section delivery. A small amount of fluid is noted within the incision, likely a benign. 3. No other acute or focal lesion to explain the patient's symptoms. The small bowel and stomach are decompressed.   Electronically Signed   By: Gennette Pac M.D.   On: 09/05/2014 20:00   Results for orders placed during the hospital encounter of  09/05/14 (from the past 24 hour(s))  CBC WITH DIFFERENTIAL     Status: Abnormal   Collection Time    09/05/14  5:40 PM      Result Value Ref Range   WBC 6.0  4.0 - 10.5 K/uL   RBC 3.69 (*) 3.87 - 5.11 MIL/uL   Hemoglobin 10.1 (*) 12.0 - 15.0 g/dL   HCT 54.0 (*) 98.1 - 19.1 %   MCV 85.6  78.0 - 100.0 fL   MCH 27.4  26.0 - 34.0 pg   MCHC 32.0  30.0 - 36.0 g/dL   RDW 47.8 (*) 29.5 - 62.1 %   Platelets 243  150 - 400 K/uL   Neutrophils Relative % 72  43 - 77 %   Neutro Abs 4.3  1.7 - 7.7 K/uL   Lymphocytes Relative 21  12 - 46 %   Lymphs Abs 1.2  0.7 - 4.0 K/uL   Monocytes Relative 6  3 - 12 %   Monocytes Absolute 0.4  0.1 - 1.0 K/uL   Eosinophils Relative 1  0 - 5 %   Eosinophils Absolute 0.1  0.0 - 0.7 K/uL   Basophils Relative 0  0 - 1 %   Basophils  Absolute 0.0  0.0 - 0.1 K/uL  COMPREHENSIVE METABOLIC PANEL     Status: Abnormal   Collection Time    09/05/14  5:40 PM      Result Value Ref Range   Sodium 138  137 - 147 mEq/L   Potassium 4.7  3.7 - 5.3 mEq/L   Chloride 106  96 - 112 mEq/L   CO2 20  19 - 32 mEq/L   Glucose, Bld 70  70 - 99 mg/dL   BUN 14  6 - 23 mg/dL   Creatinine, Ser 1.61  0.50 - 1.10 mg/dL   Calcium 9.7  8.4 - 09.6 mg/dL   Total Protein 7.0  6.0 - 8.3 g/dL   Albumin 3.1 (*) 3.5 - 5.2 g/dL   AST 18  0 - 37 U/L   ALT 12  0 - 35 U/L   Alkaline Phosphatase 183 (*) 39 - 117 U/L   Total Bilirubin <0.2 (*) 0.3 - 1.2 mg/dL   GFR calc non Af Amer >90  >90 mL/min   GFR calc Af Amer >90  >90 mL/min   Anion gap 12  5 - 15  LIPASE, BLOOD     Status: None   Collection Time    09/05/14  5:40 PM      Result Value Ref Range   Lipase 24  11 - 59 U/L  LACTIC ACID, PLASMA     Status: None   Collection Time    09/05/14  6:15 PM      Result Value Ref Range   Lactic Acid, Venous 0.7  0.5 - 2.2 mmol/L  URINALYSIS, ROUTINE W REFLEX MICROSCOPIC     Status: Abnormal   Collection Time    09/05/14  8:10 PM      Result Value Ref Range   Color, Urine YELLOW  YELLOW    APPearance CLEAR  CLEAR   Specific Gravity, Urine <1.005 (*) 1.005 - 1.030   pH 5.5  5.0 - 8.0   Glucose, UA NEGATIVE  NEGATIVE mg/dL   Hgb urine dipstick TRACE (*) NEGATIVE   Bilirubin Urine NEGATIVE  NEGATIVE   Ketones, ur NEGATIVE  NEGATIVE mg/dL   Protein, ur NEGATIVE  NEGATIVE mg/dL   Urobilinogen, UA 0.2  0.0 - 1.0 mg/dL   Nitrite NEGATIVE  NEGATIVE   Leukocytes, UA SMALL (*) NEGATIVE  URINE MICROSCOPIC-ADD ON     Status: None   Collection Time    09/05/14  8:10 PM      Result Value Ref Range   Squamous Epithelial / LPF RARE  RARE   WBC, UA 0-2  <3 WBC/hpf   RBC / HPF 0-2  <3 RBC/hpf   Bacteria, UA RARE  RARE   Urine-Other TRICHOMONAS PRESENT     Assessment and Plan  38 you [id number], s/p PLTCS 2 weeks ago, presents referred by [facility] provider for CT imaging w/u of post-operative abdominal pain and nausea. DDx includes prolonged post-operative ileus, mechanical obstruction, PUD, mesenteric ischemia, and subacute pancreatitis, and UTI or surgical site infection. In addition to oral/IV contrast CT of abdomen and pelvis, will evaluate with CBC, CMP, lipase, lactic acid, and urinalysis.  Sign-out to [employer] given 8:03 PM  WOHLERT, NOAH   09/05/2014, 5:26 PM   Evaluation and management procedures were performed by Resident physician under my supervision/collaboration. Chart reviewed, patient examined by me and I agree with management and plan. 1. Constipation, unspecified constipation type   2. Abdominal wall pain   2 wk post  op LTCS    Medication List    STOP taking these medications       prenatal multivitamin Tabs tablet      TAKE these medications       ferrous sulfate 325 (65 FE) MG tablet  Take 1 tablet (325 mg total) by mouth 2 (two) times daily with a meal.     ibuprofen 600 MG tablet  Commonly known as:  ADVIL,MOTRIN  Take 1 tablet (600 mg total) by mouth every 6 (six) hours as needed for mild pain.     magnesium hydroxide 400 MG/5ML suspension  Commonly  known as:  MILK OF MAGNESIA  Take 30 mLs by mouth daily as needed for mild constipation.     oxyCODONE-acetaminophen 5-325 MG per tablet  Commonly known as:  PERCOCET/ROXICET  Take 1-2 tablets by mouth every 4 (four) hours as needed for severe pain (for pain scale less than 7).     senna-docusate 8.6-50 MG per tablet  Commonly known as:  Senokot-S  Take 2 tablets by mouth at bedtime as needed for mild constipation.       Follow-up Information   [facility], Sanjuana MaeWALDA STACIA, MD. (Keep your scheduled appointment)    Specialty:  Obstetrics and Gynecology   Contact information:   [address] [place] [id number] [phone]

## 2014-09-05 NOTE — Progress Notes (Signed)
Pt states she threw up after she ate and continued to throw up 3-4- time

## 2014-09-05 NOTE — MAU Note (Signed)
Pt states she has not had a bowel movement for almost 3 days

## 2014-09-05 NOTE — Discharge Instructions (Signed)

## 2014-09-05 NOTE — MAU Note (Signed)
Pt states "if she eats she throws up" Pts states she has not had constipattion then 2 days of diarrhea. Pt has not had bowel movement for 3 days but has been passing gas

## 2014-09-06 NOTE — MAU Provider Note (Signed)
Reviewed case with CNM and I agree with above  Treg Diemer STACIA  

## 2014-10-02 ENCOUNTER — Encounter (HOSPITAL_COMMUNITY): Payer: Self-pay | Admitting: *Deleted

## 2016-01-29 ENCOUNTER — Encounter (HOSPITAL_COMMUNITY): Payer: Self-pay | Admitting: Emergency Medicine

## 2016-01-29 ENCOUNTER — Emergency Department (HOSPITAL_COMMUNITY): Payer: Medicaid Other

## 2016-01-29 ENCOUNTER — Emergency Department (HOSPITAL_COMMUNITY)
Admission: EM | Admit: 2016-01-29 | Discharge: 2016-01-29 | Disposition: A | Discharge status: Home / Self Care | Payer: Medicaid Other | Attending: Emergency Medicine | Admitting: Emergency Medicine

## 2016-01-29 DIAGNOSIS — R630 Anorexia: Secondary | ICD-10-CM | POA: Insufficient documentation

## 2016-01-29 DIAGNOSIS — R05 Cough: Secondary | ICD-10-CM | POA: Insufficient documentation

## 2016-01-29 DIAGNOSIS — R079 Chest pain, unspecified: Secondary | ICD-10-CM | POA: Insufficient documentation

## 2016-01-29 DIAGNOSIS — J029 Acute pharyngitis, unspecified: Secondary | ICD-10-CM | POA: Insufficient documentation

## 2016-01-29 DIAGNOSIS — R509 Fever, unspecified: Secondary | ICD-10-CM | POA: Insufficient documentation

## 2016-01-29 DIAGNOSIS — R6889 Other general symptoms and signs: Secondary | ICD-10-CM

## 2016-01-29 DIAGNOSIS — Z79899 Other long term (current) drug therapy: Secondary | ICD-10-CM | POA: Insufficient documentation

## 2016-01-29 MED ORDER — IBUPROFEN 800 MG PO TABS: 800.0000 mg | ORAL_TABLET | Freq: Three times a day (TID) | ORAL | Status: DC

## 2016-01-29 MED ORDER — ALBUTEROL SULFATE HFA 108 (90 BASE) MCG/ACT IN AERS
2.0000 | INHALATION_SPRAY | Freq: Once | RESPIRATORY_TRACT | Status: AC
  Administered 2016-01-29: 2 via RESPIRATORY_TRACT
  Filled 2016-01-29: qty 6.7

## 2016-01-29 MED ORDER — PREDNISONE 20 MG PO TABS: 40.0000 mg | ORAL_TABLET | Freq: Every day | ORAL | Status: DC

## 2016-01-29 MED ORDER — PREDNISONE 20 MG PO TABS
60.0000 mg | ORAL_TABLET | Freq: Once | ORAL | Status: AC
  Administered 2016-01-29: 60 mg via ORAL
  Filled 2016-01-29: qty 3

## 2016-01-29 MED ORDER — GUAIFENESIN-CODEINE 100-10 MG/5ML PO SOLN: 5.0000 mL | Freq: Four times a day (QID) | ORAL | Status: DC | PRN

## 2016-01-29 MED ORDER — AEROCHAMBER PLUS W/MASK MISC
1.0000 | Freq: Once | Status: AC
  Administered 2016-01-29: 1

## 2016-01-29 NOTE — ED Notes (Signed)
Pt reports cough ,chills and fever for last 3 days. Denies any n/v/d. No chest pain or sob.

## 2016-01-29 NOTE — ED Notes (Signed)
Registration at bedside.

## 2016-01-29 NOTE — ED Provider Notes (Signed)
CSN: [id number]     Arrival date & time 01/29/16  1858 History  By signing my name below, I, Sarah Owens, attest that this documentation has been prepared under the direction and in the presence of Arthor Captain, PA-C. Electronically Signed: Octavia Owens, ED Scribe. 01/29/2016. 8:09 PM.     Chief Complaint  Patient presents with  . Cough  . Chills      The history is provided by the patient and a relative. No language interpreter was used.   HPI Comments: Sarah Owens is a 40 y.o. female who presents to the Emergency Department complaining of constant, gradual worsening, moderate, dry cough with associated chills, loss of appetite, sore throat and fever onset 3 days ago. Pt has been having chest pain secondary to the cough. Pt has not been out of the country recently. She is drinking fluids normally. Pt has taken tylenol to alleviate her symptoms with minimal relief. Her last menstrual cycle was February 20th. Pt denies hx of asthma, nausea, vomiting, and diarrhea.  Past Medical History  Diagnosis Date  . No pertinent past medical history    Past Surgical History  Procedure Laterality Date  . Wisdom tooth extraction    . Cesarean section N/A 08/22/2014    Procedure: CESAREAN SECTION;  Surgeon: Philip Aspen, DO;  Location: [facility];  Service: Obstetrics;  Laterality: N/A;  . Tubal ligation Bilateral 08/22/2014    Procedure: BILATERAL TUBAL LIGATION;  Surgeon: Philip Aspen, DO;  Location: [facility];  Service: Obstetrics;  Laterality: Bilateral;   Family History  Problem Relation Age of Onset  . Anesthesia problems Neg Hx   . Alcohol abuse Neg Hx    Social History  Substance Use Topics  . Smoking status: Never Smoker   . Smokeless tobacco: None  . Alcohol Use: No   OB History    Gravida Para Term Preterm AB TAB SAB Ectopic Multiple Living   0 0 0 0 0 0 5     Review of Systems  Constitutional: Positive for fever, chills and appetite change.  HENT: Positive for sore  throat.   Respiratory: Positive for cough.   Cardiovascular: Positive for chest pain.  Gastrointestinal: Negative for nausea, vomiting and diarrhea.  All other systems reviewed and are negative.     Allergies  Review of patient's allergies indicates no known allergies.  Home Medications   Prior to Admission medications   Medication Sig Start Date End Date Taking? Authorizing Provider  ferrous sulfate 325 (65 FE) MG tablet Take 1 tablet (325 mg total) by mouth 2 (two) times daily with a meal. 08/25/14   Essie Hart, MD  ibuprofen (ADVIL,MOTRIN) 600 MG tablet Take 1 tablet (600 mg total) by mouth every 6 (six) hours as needed for mild pain. 08/25/14   Essie Hart, MD  magnesium hydroxide (MILK OF MAGNESIA) 400 MG/5ML suspension Take 30 mLs by mouth daily as needed for mild constipation. 09/05/14   Deirdre Colin Mulders, CNM  oxyCODONE-acetaminophen (PERCOCET/ROXICET) 5-325 MG per tablet Take 1-2 tablets by mouth every 4 (four) hours as needed for severe pain (for pain scale less than 7). 08/25/14   Essie Hart, MD  senna-docusate (SENOKOT-S) 8.6-50 MG per tablet Take 2 tablets by mouth at bedtime as needed for mild constipation. 08/25/14   Essie Hart, MD   Triage vitals: BP 128/75 mmHg  Pulse 82  Temp(Src) 100 F (37.8 C) (Oral)  Resp 18  SpO2 100%  LMP 01/21/2016 Physical Exam  Constitutional: She  is oriented to person, place, and time. She appears well-developed and well-nourished.  HENT:  Head: Normocephalic.  Bilateral tonsillar adenopathy, erythema to back of throat  Eyes: EOM are normal.  Glassy eyes  Neck: Normal range of motion.  Cardiovascular: Normal rate and regular rhythm.   Pulmonary/Chest: Effort normal.  Bronchitis, tight, barky cough  Abdominal: She exhibits no distension.  Musculoskeletal: Normal range of motion.  Neurological: She is alert and oriented to person, place, and time.  Skin: Skin is warm.  Psychiatric: She has a normal mood and affect.  Nursing note and vitals  reviewed.   ED Course  Procedures  DIAGNOSTIC STUDIES: Oxygen Saturation is 100% on RA, normal by my interpretation.  COORDINATION OF CARE:  8:07 PM Discussed treatment plan which includes albuterol inhaler and cough syrup with pt at bedside and pt agreed to plan.  Labs Review Labs Reviewed - No data to display  Imaging Review No results found. I have personally reviewed and evaluated these images and lab results as part of my medical decision-making.   EKG Interpretation None      MDM   Final diagnoses:  Flu-like symptoms    Patient with flu like illness Pt CXR negative for acute infiltrate. Patients symptoms are consistent with URI, likely viral etiology. Discussed that antibiotics are not indicated for viral infections. Pt will be discharged with symptomatic treatment.  Verbalizes understanding and is agreeable with plan. Pt is hemodynamically stable & in NAD prior to dc.   Medications  albuterol (PROVENTIL HFA;VENTOLIN HFA) 108 (90 Base) MCG/ACT inhaler 2 puff (2 puffs Inhalation Given 01/29/16 2025)  aerochamber plus with mask device 1 each (1 each Other Given 01/29/16 2025)  predniSONE (DELTASONE) tablet 60 mg (60 mg Oral Given 01/29/16 2025)    I personally performed the services described in this documentation, which was scribed in my presence. The recorded information has been reviewed and is accurate.       Arthor Captain, PA-C 02/03/16 1534  Cathren Laine, MD 02/08/16 0730

## 2016-01-29 NOTE — Discharge Instructions (Signed)

## 2016-01-29 NOTE — ED Notes (Signed)
Education done on appropriate inhaler and aerochamber use.  Patient and family verbalized understanding.

## 2016-01-29 NOTE — ED Notes (Signed)
Confirmation received from fax to patient's employer from PA confirming patient's illness and assessment here in the ED.  Provided all copies to patient.

## 2016-01-29 NOTE — ED Notes (Signed)
Patient able to ambulate independently  

## 2017-02-03 ENCOUNTER — Encounter: Payer: Medicaid Other | Admitting: Obstetrics & Gynecology

## 2017-02-20 ENCOUNTER — Encounter: Payer: Medicaid Other | Admitting: Obstetrics & Gynecology

## 2017-04-15 ENCOUNTER — Encounter (HOSPITAL_COMMUNITY): Payer: Self-pay | Admitting: *Deleted

## 2017-04-15 ENCOUNTER — Emergency Department (HOSPITAL_COMMUNITY)
Admission: EM | Admit: 2017-04-15 | Discharge: 2017-04-16 | Disposition: A | Discharge status: Home / Self Care | Payer: Self-pay | Attending: Emergency Medicine | Admitting: Emergency Medicine

## 2017-04-15 DIAGNOSIS — G43009 Migraine without aura, not intractable, without status migrainosus: Secondary | ICD-10-CM | POA: Insufficient documentation

## 2017-04-15 DIAGNOSIS — R109 Unspecified abdominal pain: Secondary | ICD-10-CM | POA: Insufficient documentation

## 2017-04-15 DIAGNOSIS — M5126 Other intervertebral disc displacement, lumbar region: Secondary | ICD-10-CM

## 2017-04-15 DIAGNOSIS — M5136 Other intervertebral disc degeneration, lumbar region: Secondary | ICD-10-CM

## 2017-04-15 DIAGNOSIS — M5127 Other intervertebral disc displacement, lumbosacral region: Secondary | ICD-10-CM | POA: Insufficient documentation

## 2017-04-15 MED ORDER — KETOROLAC TROMETHAMINE 30 MG/ML IJ SOLN
30.0000 mg | Freq: Once | INTRAMUSCULAR | Status: AC
  Administered 2017-04-16: 30 mg via INTRAVENOUS
  Filled 2017-04-15: qty 1

## 2017-04-15 MED ORDER — SODIUM CHLORIDE 0.9 % IV BOLUS (SEPSIS)
1000.0000 mL | Freq: Once | INTRAVENOUS | Status: AC
  Administered 2017-04-16: 1000 mL via INTRAVENOUS

## 2017-04-15 MED ORDER — DIPHENHYDRAMINE HCL 50 MG/ML IJ SOLN
25.0000 mg | Freq: Once | INTRAMUSCULAR | Status: AC
  Administered 2017-04-16: 25 mg via INTRAVENOUS
  Filled 2017-04-15: qty 1

## 2017-04-15 MED ORDER — SODIUM CHLORIDE 0.9 % IV BOLUS (SEPSIS)
500.0000 mL | Freq: Once | INTRAVENOUS | Status: AC
  Administered 2017-04-16: 500 mL via INTRAVENOUS

## 2017-04-15 MED ORDER — METOCLOPRAMIDE HCL 5 MG/ML IJ SOLN
10.0000 mg | Freq: Once | INTRAMUSCULAR | Status: AC
  Administered 2017-04-16: 10 mg via INTRAVENOUS
  Filled 2017-04-15: qty 2

## 2017-04-15 NOTE — ED Provider Notes (Signed)
[facility]-EMERGENCY DEPT Provider Note   CSN: [id number] Arrival date & time: 04/15/17  1754  By signing my name below, I, Karren Cobble, attest that this documentation has been prepared under the direction and in the presence of Devoria Albe, MD. Electronically Signed: Karren Cobble, ED Scribe. 04/15/17. 11:59 PM.  Time seen 23:35 PM   History   Chief Complaint Chief Complaint  Patient presents with  . Headache  . Back Pain   The history is provided by the patient and the spouse. No language interpreter was used.  Back Pain   Associated symptoms include numbness and headaches. Pertinent negatives include no fever and no abdominal pain.    HPI Comments: Sarah Owens is a 41 y.o. female with no pertinent PMHx, who presents to the Emergency Department complaining of gradually worsening, throbbing, left sided headache that started three days ago, which she rates 8/10. Her associated symptoms include itchy eyes, sneezing, cough (with no phlegm production), numbness in her left face that is intermittent, chills, body aches, and lower right sided back pain. She notes numbness locally to the same area of her headache. Pt states her headache is worsened with phonophobia. Her back pain is worsened with generalized movement. She has had headaches before but notes they have not been this severe. No treatment tried PTA. No tobacco use. No known recent sick contact. Denies sore throat, fever , photophobia, abdominal pain, nausea, vomiting, or diarrhea.   No PCP.   Past Medical History:  Diagnosis Date  . No pertinent past medical history     Patient Active Problem List   Diagnosis Date Noted  . Cesarean delivery delivered 08/22/2014    Past Surgical History:  Procedure Laterality Date  . CESAREAN SECTION N/A 08/22/2014   Procedure: CESAREAN SECTION;  Surgeon: Philip Aspen, DO;  Location: [facility];  Service: Obstetrics;  Laterality: N/A;  . TUBAL LIGATION Bilateral 08/22/2014   Procedure: BILATERAL  TUBAL LIGATION;  Surgeon: Philip Aspen, DO;  Location: [facility];  Service: Obstetrics;  Laterality: Bilateral;  . WISDOM TOOTH EXTRACTION      OB History    Gravida Para Term Preterm AB Living   8 8 8  0 0 5   SAB TAB Ectopic Multiple Live Births   0 0 0 0 6       Home Medications    Prior to Admission medications   Medication Sig Start Date End Date Taking? Authorizing Provider  cyclobenzaprine (FLEXERIL) 5 MG tablet Take 1 tablet (5 mg total) by mouth 3 (three) times daily as needed (muscle soreness). 04/16/17   Devoria Albe, MD  ferrous sulfate 325 (65 FE) MG tablet Take 1 tablet (325 mg total) by mouth 2 (two) times daily with a meal. Patient not taking: Reported on 04/16/2017 08/25/14   Essie Hart, MD  guaiFENesin-codeine 100-10 MG/5ML syrup Take 5-10 mLs by mouth every 6 (six) hours as needed for cough. Patient not taking: Reported on 04/16/2017 01/29/16   Arthor Captain, PA-C  ibuprofen (ADVIL,MOTRIN) 800 MG tablet Take 1 tablet (800 mg total) by mouth 3 (three) times daily. Patient not taking: Reported on 04/16/2017 01/29/16   Arthor Captain, PA-C  magnesium hydroxide (MILK OF MAGNESIA) 400 MG/5ML suspension Take 30 mLs by mouth daily as needed for mild constipation. Patient not taking: Reported on 04/16/2017 09/05/14   Poe, Deirdre C, CNM  naproxen (NAPROSYN) 500 MG tablet Take 1 tablet (500 mg total) by mouth 2 (two) times daily. 04/16/17   Devoria Albe, MD  oxyCODONE-acetaminophen (PERCOCET/ROXICET)  5-325 MG per tablet Take 1-2 tablets by mouth every 4 (four) hours as needed for severe pain (for pain scale less than 7). Patient not taking: Reported on 04/16/2017 08/25/14   Essie Hart, MD  predniSONE (DELTASONE) 20 MG tablet Take 2 tablets (40 mg total) by mouth daily. Patient not taking: Reported on 04/16/2017 01/29/16   Arthor Captain, PA-C  senna-docusate (SENOKOT-S) 8.6-50 MG per tablet Take 2 tablets by mouth at bedtime as needed for mild constipation. Patient not taking: Reported  on 04/16/2017 08/25/14   Essie Hart, MD    Family History Family History  Problem Relation Age of Onset  . Anesthesia problems Neg Hx   . Alcohol abuse Neg Hx     Social History Social History  Substance Use Topics  . Smoking status: Never Smoker  . Smokeless tobacco: Not on file  . 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   Review of Systems Review of Systems  Constitutional: Positive for chills. Negative for fever.  HENT: Positive for rhinorrhea and sneezing. Negative for sore throat.   Eyes: Positive for itching. Negative for photophobia and visual disturbance.  Respiratory: Positive for cough.   Gastrointestinal: Negative for abdominal pain, diarrhea, nausea and vomiting.  Musculoskeletal: Positive for back pain and myalgias.  Neurological: Positive for numbness and headaches.  All other systems reviewed and are negative.    Physical Exam Updated Vital Signs BP (!) 123/59   Pulse (!) 53   Temp 98.5 F (36.9 C) (Oral)   Resp 14   LMP 04/01/2017   SpO2 100%   Vital signs normal except for bradycarida   Physical Exam  Constitutional: She is oriented to person, place, and time. She appears well-developed and well-nourished.  Non-toxic appearance. She does not appear ill. No distress.  HENT:  Head: Normocephalic and atraumatic.  Right Ear: External ear normal.  Left Ear: External ear normal.  Nose: Nose normal. No mucosal edema or rhinorrhea.  Mouth/Throat: Oropharynx is clear and moist. Mucous membranes are dry. No dental abscesses or uvula swelling.  Eyes: Conjunctivae and EOM are normal. Pupils are equal, round, and reactive to light.  Neck: Normal range of motion and full passive range of motion without pain. Neck supple.    Diffusely tender in the left paraspinous muscles of the cervical spine.   Cardiovascular: Normal rate, regular rhythm and normal heart sounds.   Exam reveals no gallop and no friction rub.   No murmur heard. Pulmonary/Chest: Effort normal and breath sounds normal. No respiratory distress. She has no wheezes. She has no rhonchi. She has no rales. She exhibits no tenderness and no crepitus.  Abdominal: Soft. Normal appearance and bowel sounds are normal. She exhibits no distension. There is no tenderness. There is no rebound and no guarding.  Genitourinary:  Genitourinary Comments: RCVAT.   Musculoskeletal: Normal range of motion. She exhibits no edema or tenderness.       Back:  Moves all extremities well. Non-tender thoracic or lumbar spine.   Neurological: She is alert and oriented to person, place, and time. She has normal strength. No cranial nerve deficit.  No focal weakness.   Skin: Skin is warm, dry and intact. No rash noted. No erythema. No pallor.  Psychiatric: She has a normal mood and affect. Her speech is normal and behavior is normal. Her mood appears not anxious.  Nursing note and vitals reviewed.  ED  Treatments / Results  Labs (all labs ordered are listed, but only abnormal results are displayed) Results for orders placed or performed during the hospital encounter of 04/15/17  Urinalysis, Routine w reflex microscopic  Result Value Ref Range   Color, Urine YELLOW YELLOW   APPearance HAZY (A) CLEAR   Specific Gravity, Urine 1.013 1.005 - 1.030   pH 6.0 5.0 - 8.0   Glucose, UA NEGATIVE NEGATIVE mg/dL   Hgb urine dipstick NEGATIVE NEGATIVE   Bilirubin Urine NEGATIVE NEGATIVE   Ketones, ur NEGATIVE NEGATIVE mg/dL   Protein, ur NEGATIVE NEGATIVE mg/dL   Nitrite NEGATIVE NEGATIVE   Leukocytes, UA LARGE (A) NEGATIVE   RBC / HPF 6-30 0 - 5 RBC/hpf   WBC, UA 0-5 0 - 5 WBC/hpf   Bacteria, UA RARE (A) NONE SEEN   Squamous Epithelial / LPF 0-5 (A) NONE SEEN  Pregnancy, urine  Result Value Ref Range   Preg Test, Ur NEGATIVE NEGATIVE  Comprehensive metabolic panel  Result Value Ref Range   Sodium 138 135 - 145 mmol/L     Potassium 4.4 3.5 - 5.1 mmol/L   Chloride 110 101 - 111 mmol/L   CO2 24 22 - 32 mmol/L   Glucose, Bld 98 65 - 99 mg/dL   BUN 11 6 - 20 mg/dL   Creatinine, Ser 7.82 0.44 - 1.00 mg/dL   Calcium 95.6 8.9 - 21.3 mg/dL   Total Protein 7.4 6.5 - 8.1 g/dL   Albumin 3.7 3.5 - 5.0 g/dL   AST 27 15 - 41 U/L   ALT 21 14 - 54 U/L   Alkaline Phosphatase 71 38 - 126 U/L   Total Bilirubin 0.2 (L) 0.3 - 1.2 mg/dL   GFR calc non Af Amer >60 >60 mL/min   GFR calc Af Amer >60 >60 mL/min   Anion gap 4 (L) 5 - 15  CBC with Differential  Result Value Ref Range   WBC 5.3 4.0 - 10.5 K/uL   RBC 4.94 3.87 - 5.11 MIL/uL   Hemoglobin 10.1 (L) 12.0 - 15.0 g/dL   HCT 08.6 (L) 57.8 - 46.9 %   MCV 69.6 (L) 78.0 - 100.0 fL   MCH 20.4 (L) 26.0 - 34.0 pg   MCHC 29.4 (L) 30.0 - 36.0 g/dL   RDW 62.9 (H) 52.8 - 41.3 %   Platelets 244 150 - 400 K/uL   Neutrophils Relative % 34 %   Lymphocytes Relative 47 %   Monocytes Relative 11 %   Eosinophils Relative 8 %   Basophils Relative 0 %   Neutro Abs 1.8 1.7 - 7.7 K/uL   Lymphs Abs 2.5 0.7 - 4.0 K/uL   Monocytes Absolute 0.6 0.1 - 1.0 K/uL   Eosinophils Absolute 0.4 0.0 - 0.7 K/uL   Basophils Absolute 0.0 0.0 - 0.1 K/uL   RBC Morphology POLYCHROMASIA PRESENT   Lipase, blood  Result Value Ref Range   Lipase 25 11 - 51 U/L   Laboratory interpretation all normal     EKG  EKG Interpretation None       Radiology Dg Chest 2 View  Result Date: 04/16/2017 CLINICAL DATA:  Headache, cough and chills EXAM: CHEST  2 VIEW COMPARISON:  Chest radiograph 01/29/2016 FINDINGS: The heart size and mediastinal contours are within normal limits. Both lungs are clear. The visualized skeletal structures are unremarkable. IMPRESSION: No active cardiopulmonary disease. Electronically Signed   By: Deatra Robinson M.D.   On: 04/16/2017 00:31   Ct Head Wo  Contrast  Result Date: 04/16/2017 CLINICAL DATA:  Left-sided headache for 2 days. Intermittent facial numbness. EXAM: CT  HEAD WITHOUT CONTRAST TECHNIQUE: Contiguous axial images were obtained from the base of the skull through the vertex without intravenous contrast. COMPARISON:  Head CT 10/22/2010 FINDINGS: Brain: No evidence of acute infarction, hemorrhage, hydrocephalus, extra-axial collection or mass lesion/mass effect. Vascular: No hyperdense vessel or unexpected calcification. Skull: No fracture or focal lesion. Sinuses/Orbits: Minimal mucosal thickening of the ethmoid air cells and right frontal sinus. No sinus fluid levels. Mastoid air cells are well-aerated. Visualized orbits are unremarkable. Other: None. IMPRESSION: 1.  No acute intracranial abnormality. 2. Minimal sinus mucosal thickening. Electronically Signed   By: Rubye OaksMelanie  Ehinger M.D.   On: 04/16/2017 00:57   Ct Renal Stone Study  Result Date: 04/16/2017 CLINICAL DATA:  Right flank pain. History of Cesarean section and tubal ligation. EXAM: CT ABDOMEN AND PELVIS WITHOUT CONTRAST TECHNIQUE: Multidetector CT imaging of the abdomen and pelvis was performed following the standard protocol without IV contrast. COMPARISON:  None. FINDINGS: Lower chest: No acute abnormality. Hepatobiliary: Tiny nonobstructing gallstones are seen along the dependent wall tumor largest approximately 4 mm. No wall thickening or pericholecystic fluid. The unenhanced liver is unremarkable. Pancreas: Unremarkable. No pancreatic ductal dilatation or surrounding inflammatory changes. Spleen: Normal in size without focal abnormality. Adrenals/Urinary Tract: Adrenal glands are unremarkable. Kidneys are normal, without renal calculi, focal lesion, or hydronephrosis. Bladder is unremarkable. Stomach/Bowel: Stomach is within normal limits. Appendix appears normal. No evidence of bowel wall thickening, distention, or inflammatory changes. A moderate to large amount of fecal residue is noted within large bowel. Vascular/Lymphatic: No significant vascular findings are present. No enlarged abdominal or  pelvic lymph nodes. Reproductive: Uterus and bilateral adnexa are unremarkable. Tubal ligation clips are noted bilaterally. Other: Small fat containing umbilical hernia. No abdominopelvic ascites. Musculoskeletal: Broad-based disc bulge at L5-S1 touching upon the thecal sac. No acute nor suspicious osseous abnormality. IMPRESSION: 1. L5-S1 broad-based disc bulge. 2. No nephrolithiasis nor obstructive uropathy. 3. Uncomplicated cholelithiasis. 4. No bowel obstruction or inflammation. 5. Bilateral tubal ligation clips are present. Electronically Signed   By: Tollie Ethavid  Kwon M.D.   On: 04/16/2017 02:02    Procedures Procedures (including critical care time)  Medications Ordered in ED Medications  sodium chloride 0.9 % bolus 1,000 mL (0 mLs Intravenous Stopped 04/16/17 0346)  sodium chloride 0.9 % bolus 500 mL (0 mLs Intravenous Stopped 04/16/17 0346)  metoCLOPramide (REGLAN) injection 10 mg (10 mg Intravenous Given 04/16/17 0020)  diphenhydrAMINE (BENADRYL) injection 25 mg (25 mg Intravenous Given 04/16/17 0020)  ketorolac (TORADOL) 30 MG/ML injection 30 mg (30 mg Intravenous Given 04/16/17 0021)     Initial Impression / Assessment and Plan / ED Course  I have reviewed the triage vital signs and the nursing notes.  Pertinent labs & imaging results that were available during my care of the patient were reviewed by me and considered in my medical decision making (see chart for details).     DIAGNOSTIC STUDIES: Oxygen Saturation is 100% on RA, normal  by my interpretation.   COORDINATION OF CARE: 11:50 PM-Discussed next steps with pt. Pt verbalized understanding and is agreeable with the plan. Pt was given a migraine cocktail for her headache. She had radiology studies ordered to evaluate her right flank pain, headache and her cough with myalgias.   12:45 AM Nurse reports patient was complaining of pain at the IV site he removed the IV. He asked me if he should restart it,  I asked him to talk to the  patient and see if she wanted it restarted.  2:35 AM - Pt states headache and back pain have resolved. Husband is upset become no one came back in to start pt's IV. Nursing staff informed to restart her IV.   3:23 AM Pt's husband approached me and stated no one came into their room to start another IV, and she is requesting to leave at this time. She is feeling better.   Final Clinical Impressions(s) / ED Diagnoses   Final diagnoses:  Migraine without aura and without status migrainosus, not intractable  Acute right flank pain  Bulging lumbar disc    New Prescriptions Discharge Medication List as of 04/16/2017  3:32 AM    START taking these medications   Details  cyclobenzaprine (FLEXERIL) 5 MG tablet Take 1 tablet (5 mg total) by mouth 3 (three) times daily as needed (muscle soreness)., Starting Thu 04/16/2017, Print    naproxen (NAPROSYN) 500 MG tablet Take 1 tablet (500 mg total) by mouth 2 (two) times daily., Starting Thu 04/16/2017, Print       Plan discharge  Devoria Albe, MD, FACEP   I personally performed the services described in this documentation, which was scribed in my presence. The recorded information has been reviewed and considered.  Devoria Albe, MD, Concha Pyo, MD 04/16/17 [phone]

## 2017-04-15 NOTE — ED Triage Notes (Signed)
Pt and family member reports two days of headache, lower back pain, cough, chills. Denies urinary symptoms.

## 2017-04-16 ENCOUNTER — Emergency Department (HOSPITAL_COMMUNITY): Payer: Self-pay

## 2017-04-16 LAB — COMPREHENSIVE METABOLIC PANEL
ALBUMIN: 3.7 g/dL (ref 3.5–5.0)
ALT: 21 U/L (ref 14–54)
ANION GAP: 4 — AB (ref 5–15)
AST: 27 U/L (ref 15–41)
Alkaline Phosphatase: 71 U/L (ref 38–126)
BILIRUBIN TOTAL: 0.2 mg/dL — AB (ref 0.3–1.2)
BUN: 11 mg/dL (ref 6–20)
CO2: 24 mmol/L (ref 22–32)
Calcium: 10.2 mg/dL (ref 8.9–10.3)
Chloride: 110 mmol/L (ref 101–111)
Creatinine, Ser: 0.68 mg/dL (ref 0.44–1.00)
GFR calc Af Amer: >60 mL/min (ref >60)
GFR calc non Af Amer: >60 mL/min (ref >60)
Glucose, Bld: 98 mg/dL (ref 65–99)
POTASSIUM: 4.4 mmol/L (ref 3.5–5.1)
Sodium: 138 mmol/L (ref 135–145)
TOTAL PROTEIN: 7.4 g/dL (ref 6.5–8.1)

## 2017-04-16 LAB — CBC WITH DIFFERENTIAL/PLATELET
BASOS ABS: 0 10*3/uL (ref 0.0–0.1)
Basophils Relative: 0 %
Eosinophils Absolute: 0.4 10*3/uL (ref 0.0–0.7)
Eosinophils Relative: 8 %
HCT: 34.4 % — ABNORMAL LOW (ref 36.0–46.0)
HEMOGLOBIN: 10.1 g/dL — AB (ref 12.0–15.0)
LYMPHS PCT: 47 %
Lymphs Abs: 2.5 10*3/uL (ref 0.7–4.0)
MCH: 20.4 pg — AB (ref 26.0–34.0)
MCHC: 29.4 g/dL — AB (ref 30.0–36.0)
MCV: 69.6 fL — AB (ref 78.0–100.0)
MONOS PCT: 11 %
Monocytes Absolute: 0.6 10*3/uL (ref 0.1–1.0)
NEUTROS ABS: 1.8 10*3/uL (ref 1.7–7.7)
Neutrophils Relative %: 34 %
Platelets: 244 10*3/uL (ref 150–400)
RBC: 4.94 MIL/uL (ref 3.87–5.11)
RDW: 19 % — ABNORMAL HIGH (ref 11.5–15.5)
WBC: 5.3 10*3/uL (ref 4.0–10.5)

## 2017-04-16 LAB — URINALYSIS, ROUTINE W REFLEX MICROSCOPIC
Bilirubin Urine: NEGATIVE
GLUCOSE, UA: NEGATIVE mg/dL
Hgb urine dipstick: NEGATIVE
KETONES UR: NEGATIVE mg/dL
Nitrite: NEGATIVE
PROTEIN: NEGATIVE mg/dL
Specific Gravity, Urine: 1.013 (ref 1.005–1.030)
pH: 6 (ref 5.0–8.0)

## 2017-04-16 LAB — LIPASE, BLOOD: LIPASE: 25 U/L (ref 11–51)

## 2017-04-16 LAB — PREGNANCY, URINE: Preg Test, Ur: NEGATIVE

## 2017-04-16 MED ORDER — CYCLOBENZAPRINE HCL 5 MG PO TABS: 5.0000 mg | ORAL_TABLET | Freq: Three times a day (TID) | ORAL | 0 refills | Status: DC | PRN

## 2017-04-16 MED ORDER — NAPROXEN 500 MG PO TABS: 500.0000 mg | ORAL_TABLET | Freq: Two times a day (BID) | ORAL | 0 refills | Status: DC

## 2017-04-16 NOTE — ED Notes (Signed)
Patient transported to CT 

## 2017-04-16 NOTE — ED Notes (Signed)
Patient transported to X-ray 

## 2017-04-16 NOTE — ED Notes (Signed)
Pt departed in NAD, refused use of wheelchair.  

## 2017-04-16 NOTE — ED Notes (Signed)
Pt reporting pain to IV site. Infusion paused.

## 2017-04-16 NOTE — ED Notes (Signed)
Called Main Lab to confirm receipt of blood work. Tech noted they have the blood.

## 2017-04-16 NOTE — Discharge Instructions (Signed)
Use ice and heat for comfort. Take the medications as prescribed. If you continue to have the pain in your right back, consider seeing one of the back specialists to further treatment.

## 2018-05-14 NOTE — Telephone Encounter (Signed)
Preadmission screen  

## 2018-11-30 ENCOUNTER — Ambulatory Visit (INDEPENDENT_AMBULATORY_CARE_PROVIDER_SITE_OTHER): Payer: Self-pay | Admitting: Internal Medicine

## 2018-11-30 ENCOUNTER — Encounter: Payer: Self-pay | Admitting: Internal Medicine

## 2018-11-30 VITALS — BP 128/88 | HR 80 | Temp 98.4°F | Resp 12 | Ht 60.0 in | Wt 185.0 lb

## 2018-11-30 DIAGNOSIS — B349 Viral infection, unspecified: Secondary | ICD-10-CM

## 2018-11-30 MED ORDER — OSELTAMIVIR PHOSPHATE 75 MG PO CAPS: ORAL_CAPSULE | ORAL | 0 refills | Status: DC

## 2018-11-30 NOTE — Patient Instructions (Addendum)
Ibuprofen 400-800 mg (2-4 tabs by mouth) every 6 hours with food or drink as needed for sore throat, headache, body aches.   Find [facility] brand of Dayquil/Nyquil liquid and follow directions for dosing.  May just want to take 15 ml of Nyquil if makes to sleepy in the morning.   Take Dayquil during the day and Nyquil at bedtime.  Keep all Dayquil and Nyquil doses 4 hours apart. Do not take Tylenol with Dayquil and Nyquil  Drink lots of water--sip water constantly

## 2018-11-30 NOTE — Progress Notes (Signed)
   Subjective:    Patient ID: Sarah NettlesMpya Owens, female    DOB: 10/06/1976, 42 y.o.   MRN: [id number]  HPI   Here to establish Was seeing husband and she is obviously ill.  Daughter,Sarah Owens, interprets Swahili to [place]  Has been ill for 3 days.  Started with sore throat., headache, coughing, runny nose, bodyaches.  Has felt like she has had a fever, but has not taken temp. No nausea or vomiting.  No diarrhea. No chest pain or dyspnea Having difficulty drinking due to sore throat pain. Has taken Tylenol 325 mg up to 3 tabs twice daily.  Does not help much. Large family with 6 of the children having URI symptoms, but not as severe as their mom.   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 She does tolerate Ibuprofen okay but has not tried it with this illness. Has not tried any cold remedies for this illness.   No outpatient medications have been marked as taking for the 11/30/18 encounter (Office Visit) with Sarah Owens, Williom Cedar, MD.    No Known Allergies   Review of Systems     Objective:   Physical Exam Lying down, but able to sit up and converse easily.  Appears moderately ill HEENT: PERRL, EOMI, conjunctivae mildly injected.  TMs dull but without injection.  Nasal mucosa on left inflamed.  Throat without injection or exudate.  NT over sinuses.  MMM Neck:  Supple, No adenopathy Chest:  CTA CV:   RRR without murmur or rub.  Radial and DP pulses normal and equal Abd:  S, NT, No HSM or mass, + BS LE:  No edema Skin:  No rash.  Brisk cap refill.      Assessment & Plan:  Acute viral syndrome:  Supportive care with ibuprofen/Dayquil/Nyquil Push clear liquids. Tamiflu 75 mg twice daily for 5 days. Call if unable to take fluids well To bring in insurance card with next visit

## 2019-02-23 ENCOUNTER — Ambulatory Visit: Payer: Medicaid Other | Admitting: Internal Medicine

## 2019-04-05 ENCOUNTER — Encounter: Payer: Self-pay | Admitting: Internal Medicine

## 2019-04-05 ENCOUNTER — Other Ambulatory Visit: Payer: Self-pay

## 2019-04-05 ENCOUNTER — Ambulatory Visit: Payer: Medicaid Other | Admitting: Internal Medicine

## 2019-04-05 VITALS — BP 122/80 | HR 60 | Resp 12 | Ht 60.0 in | Wt 188.0 lb

## 2019-04-05 DIAGNOSIS — Z13220 Encounter for screening for lipoid disorders: Secondary | ICD-10-CM | POA: Diagnosis not present

## 2019-04-05 DIAGNOSIS — N920 Excessive and frequent menstruation with regular cycle: Secondary | ICD-10-CM | POA: Insufficient documentation

## 2019-04-05 DIAGNOSIS — E66812 Obesity, class 2: Secondary | ICD-10-CM | POA: Insufficient documentation

## 2019-04-05 DIAGNOSIS — Z6837 Body mass index (BMI) 37.0-37.9, adult: Secondary | ICD-10-CM

## 2019-04-05 DIAGNOSIS — R5383 Other fatigue: Secondary | ICD-10-CM

## 2019-04-05 DIAGNOSIS — E119 Type 2 diabetes mellitus without complications: Secondary | ICD-10-CM

## 2019-04-05 DIAGNOSIS — E669 Obesity, unspecified: Secondary | ICD-10-CM

## 2019-04-05 DIAGNOSIS — R109 Unspecified abdominal pain: Secondary | ICD-10-CM

## 2019-04-05 MED ORDER — FAMOTIDINE 40 MG PO TABS: ORAL_TABLET | ORAL | 4 refills | Status: DC

## 2019-04-05 NOTE — Patient Instructions (Signed)

## 2019-04-05 NOTE — Progress Notes (Signed)
Subjective:    Patient ID: Sarah Owens, female   DOB: 12/21/1975, 43 y.o.   MRN: [id number]   HPI   Here to really establish, though seen for illness in 10/2018 Daughter interprets.  1. Describes burning pain in bilateral mid abdomen and then moves to mid and left mid epigastrium.  When she has the pain, she feels tired and hurts all over with her muscles. Has had this for 4 months.   Feels this is related to her C section scar.   Pain comes and goes for a couple of minutes on and off every day. The pain is much worse during time of menstruation. She does have heavy bleeding with her periods and must use heavy pads. Until  2010, she had 3 day periods that were not heavy. now lasting 7 days. She has had microcytic anemia in past, 2012 and 2015.   Eating food does not affect the pain. Having a BM or urination does not affect the pain. No associated diarrhea, melena or hematochezia.  No hematuria.   Last child born in 202015.  [id number]  One son died at age 317 months from what sounds like pneumonia.  She has children ranging from 275 yo to 42 yo.    2.  Works night shift.  Would like to work day shift so she can care for her husband, Ty HiltsJoseph Pampo, who has chronic health issues.  Wondering if she could get a note for this.  Past Medical History:  Diagnosis Date  . No pertinent past medical history     Past Surgical History:  Procedure Laterality Date  . CESAREAN SECTION N/A 08/22/2014   Procedure: CESAREAN SECTION;  Surgeon: Philip AspenSidney Callahan, DO;  Location: [facility];  Service: Obstetrics;  Laterality: N/A;, for breech presentation.  . TUBAL LIGATION Bilateral 08/22/2014   Procedure: BILATERAL TUBAL LIGATION;  Surgeon: Philip AspenSidney Callahan, DO;  Location: [facility];  Service: Obstetrics;  Laterality: Bilateral;  . WISDOM TOOTH EXTRACTION      Family History  Problem Relation Age of Onset  . Pneumonia Son    Social History   Socioeconomic History  . Marital status: Married    Spouse name:  Ty HiltsJoseph Pampo  . Number of children: 5  . Years of education: 8  . Highest education level: 8th grade  Occupational History  . Not on file  Social Needs  . Financial resource strain: Not on file  . Food insecurity:    Worry: Never true    Inability: Never true  . Transportation needs:    Medical: No    Non-medical: No  Tobacco Use  . Smoking status: Never Smoker  . Smokeless tobacco: Never Used  Substance and Sexual Activity  . Alcohol use: No  . Drug use: No  . Sexual activity: Yes    Birth control/protection: Surgical  Lifestyle  . Physical activity:    Days per week: Not on file    Minutes per session: Not on file  . Stress: Not on file  Relationships  . Social connections:    Talks on phone: Not on file    Gets together: Not on file    Attends religious service: Never    Active member of club or organization: Not on file    Attends meetings of clubs or organizations: Not on file    Relationship status: Not on file  . Intimate partner violence:    Fear of current or ex partner: Not on file    Emotionally  abused: Not on file    Physically abused: Not on file    Forced sexual activity: Not on file  Other Topics Concern  . Not on file  Social History Narrative   Lives at home with husband, Ty Hilts (listed as Ty Hilts), who has significant health issues, as well as her 5 children and some older adult step children.                No outpatient medications have been marked as taking for the 04/05/19 encounter (Office Visit) with Julieanne Manson, MD.   No Known Allergies   Review of Systems    Objective:   BP 122/80 (BP Location: Left Arm, Patient Position: Sitting, Cuff Size: Normal)   Pulse 60   Resp 12   Ht 5' (1.524 m)   Wt 188 lb (85.3 kg)   LMP 04/02/2019   BMI 36.72 kg/m   Physical Exam  NAD HEENT:  PERRL, EOMI, TMs pearly gray, throat without injection Neck:  Supple, No adenopathy, no thyromegaly Chest:  CTA CV:  RRR with  normal S1 and S2, No S3, S4 or murmur.  Radial and DP pulses normal ad equal Abd:  S, + BS, No HSM or mass, tender in midepigastrium and LLQ the most, somewhat in RLQ.  No rebound or peritoneal signs.  Obese. LE:  No edema   Assessment & Plan   1.  Abdominal complaints:  Sounds most related to her periods.  Body habitus limits exam.  CBC, CMP, pelvic ultrasound. For the epigastric tenderness and pain:  Possible gastritis:  Famotidine 40 mg at bedtime daily.  2.  Obesity:  Discussed improved diet and going for a walk when she gets up in late morning.  FLP  3.  Fatigue:  Labs as above plus TSH  4.  Menorrhagia:  CBC, pelvic ultrasound

## 2019-04-06 LAB — CBC WITH DIFFERENTIAL/PLATELET
Basophils Absolute: 0.1 10*3/uL (ref 0.0–0.2)
Basos: 1 %
EOS (ABSOLUTE): 0.3 10*3/uL (ref 0.0–0.4)
Eos: 8 %
Hematocrit: 34.2 % (ref 34.0–46.6)
Hemoglobin: 11 g/dL — ABNORMAL LOW (ref 11.1–15.9)
Immature Grans (Abs): 0 10*3/uL (ref 0.0–0.1)
Immature Granulocytes: 0 %
Lymphocytes Absolute: 1.2 10*3/uL (ref 0.7–3.1)
Lymphs: 32 %
MCH: 24.7 pg — ABNORMAL LOW (ref 26.6–33.0)
MCHC: 32.2 g/dL (ref 31.5–35.7)
MCV: 77 fL — ABNORMAL LOW (ref 79–97)
Monocytes Absolute: 0.5 10*3/uL (ref 0.1–0.9)
Monocytes: 14 %
Neutrophils Absolute: 1.7 10*3/uL (ref 1.4–7.0)
Neutrophils: 45 %
Platelets: 247 10*3/uL (ref 150–450)
RBC: 4.46 x10E6/uL (ref 3.77–5.28)
RDW: 16.1 % — ABNORMAL HIGH (ref 11.7–15.4)
WBC: 3.9 10*3/uL (ref 3.4–10.8)

## 2019-04-06 LAB — COMPREHENSIVE METABOLIC PANEL
ALT: 12 IU/L (ref 0–32)
AST: 16 IU/L (ref 0–40)
Albumin/Globulin Ratio: 1.3 (ref 1.2–2.2)
Albumin: 3.9 g/dL (ref 3.8–4.8)
Alkaline Phosphatase: 65 IU/L (ref 39–117)
BUN/Creatinine Ratio: 15 (ref 9–23)
BUN: 11 mg/dL (ref 6–24)
Bilirubin Total: <0.2 mg/dL (ref 0.0–1.2)
CO2: 21 mmol/L (ref 20–29)
Calcium: 10.3 mg/dL — ABNORMAL HIGH (ref 8.7–10.2)
Chloride: 103 mmol/L (ref 96–106)
Creatinine, Ser: 0.74 mg/dL (ref 0.57–1.00)
GFR calc Af Amer: 116 mL/min/{1.73_m2} (ref >59)
GFR calc non Af Amer: 100 mL/min/{1.73_m2} (ref >59)
Globulin, Total: 3.1 g/dL (ref 1.5–4.5)
Glucose: 172 mg/dL — ABNORMAL HIGH (ref 65–99)
Potassium: 5.2 mmol/L (ref 3.5–5.2)
Sodium: 137 mmol/L (ref 134–144)
Total Protein: 7 g/dL (ref 6.0–8.5)

## 2019-04-06 LAB — LIPID PANEL W/O CHOL/HDL RATIO
Cholesterol, Total: 156 mg/dL (ref 100–199)
HDL: 56 mg/dL (ref >39)
LDL Calculated: 89 mg/dL (ref 0–99)
Triglycerides: 56 mg/dL (ref 0–149)
VLDL Cholesterol Cal: 11 mg/dL (ref 5–40)

## 2019-04-06 LAB — TSH: TSH: 2.22 u[IU]/mL (ref 0.450–4.500)

## 2019-04-09 LAB — HGB A1C W/O EAG: Hgb A1c MFr Bld: 8.9 % — ABNORMAL HIGH (ref 4.8–5.6)

## 2019-04-09 LAB — SPECIMEN STATUS REPORT

## 2019-04-11 MED ORDER — ACCU-CHEK FASTCLIX LANCETS MISC: 11 refills | Status: DC

## 2019-04-11 MED ORDER — ACCU-CHEK AVIVA PLUS W/DEVICE KIT: PACK | 0 refills | Status: DC

## 2019-04-11 MED ORDER — METFORMIN HCL ER 500 MG PO TB24: 500.0000 mg | ORAL_TABLET | Freq: Every day | ORAL | 11 refills | Status: DC

## 2019-04-11 MED ORDER — GLUCOSE BLOOD VI STRP: ORAL_STRIP | 12 refills | Status: DC

## 2019-04-11 NOTE — Addendum Note (Signed)
Addended by: Marcene Duos on: 04/11/2019 10:52 AM   Modules accepted: Orders

## 2019-06-06 ENCOUNTER — Other Ambulatory Visit: Payer: Self-pay

## 2019-06-06 ENCOUNTER — Telehealth: Payer: BC Managed Care – PPO | Admitting: Internal Medicine

## 2019-06-06 DIAGNOSIS — Z91199 Patient's noncompliance with other medical treatment and regimen due to unspecified reason: Secondary | ICD-10-CM | POA: Insufficient documentation

## 2019-06-06 DIAGNOSIS — E669 Obesity, unspecified: Secondary | ICD-10-CM

## 2019-06-06 DIAGNOSIS — N920 Excessive and frequent menstruation with regular cycle: Secondary | ICD-10-CM

## 2019-06-06 DIAGNOSIS — R109 Unspecified abdominal pain: Secondary | ICD-10-CM | POA: Diagnosis not present

## 2019-06-06 DIAGNOSIS — Z6837 Body mass index (BMI) 37.0-37.9, adult: Secondary | ICD-10-CM

## 2019-06-06 DIAGNOSIS — E119 Type 2 diabetes mellitus without complications: Secondary | ICD-10-CM | POA: Diagnosis not present

## 2019-06-06 DIAGNOSIS — Z9119 Patient's noncompliance with other medical treatment and regimen: Secondary | ICD-10-CM

## 2019-06-06 MED ORDER — ACCU-CHEK SOFTCLIX LANCETS MISC: 11 refills | Status: DC

## 2019-06-06 MED ORDER — METFORMIN HCL ER 500 MG PO TB24: ORAL_TABLET | ORAL | 11 refills | Status: DC

## 2019-06-06 NOTE — Progress Notes (Signed)
Done

## 2019-06-06 NOTE — Progress Notes (Signed)
    Subjective:    Patient ID: Sarah Owens, female   DOB: [birth year], 43 y.o.   MRN: [id number]   HPI   Virtual visit via [employer]. Daughter, Sarah Owens, interprets.  1.  Abdominal pain:  She states she no longer has the pain.  She has only filled her Famotidine once on May 5th and still has 5 pills left. States she just forgot to pick up.   2. New diagnosis DM:  Did not pick up Metformin ER back in May.  State they went to pharmacy, but pharmacy did not give it to her.  They did not write it down when nursing called to show the pharmacy. She does have polydipsia and polyuria.  She only drinks water during her day.   Since diagnosis of DM after visit in May, she has been walking 1-2 times weekly.   She is eating brown rice and more greens and vegetables and fruit.  Eating more fish and less red meat.    Sits down as family to eat regularly. She feels she has lost some weight since seen in May.   They have not used the glucometer as did not know how to use.  Also needs Softclix lancet, not the drum we sent.    3.  Menorrhagia with anemia:  She is only having one day of heavy flow now and the rest of the days are much lighter.   She does not have voicemail set up and always hard to get hold of. She states she never heard from Ultrasound about setting up her pelvic ultrasound. She is light headed/fatigued at times.  She never picked up the ferrous gluconate as called to the family     Current Meds  Medication Sig  . famotidine (PEPCID) 40 MG tablet 1 tab by mouth at bedtime   No Known Allergies   Review of Systems    Objective:   There were no vitals taken for this visit.  Physical Exam  Looks well   Assessment & Plan  1.  Abdominal pain:  Resolved and not taking Famotidine regularly.  To take for 2 more months and then discontinue and see if remains asymptomatic.  2.  Menorrhagia with anemia:  To go get Ferrous gluconate 324 mg once daily with half and orange.   Spelled out  mediction Discussed her fatigue and light headedness likely due to poorly controlled DM and anemia.  Need to get these treated to improve symptoms.  3.  DM:  Did not get started as expected.  Went over checking sugars pre meal twice daily. Schedule nurse visit in one week for how to use glucometer. Change to Metformin ER 1000 mg daily with breakfast.  Sounds like she has made some lifestyle changes.  4.  Barriers to care:  Unable to ascertain why not following through as have worked with family extensively to make sure they understand the care:    Discussion on notifying clinic if problems understanding what she is to do.   Will ask our LCSWA, Teresits Maxey to check in with them to make sure things are getting done. Patrica Duel is limited in working with them due to increased risk for COVID19

## 2019-06-15 ENCOUNTER — Other Ambulatory Visit: Payer: BC Managed Care – PPO

## 2019-07-11 ENCOUNTER — Ambulatory Visit: Payer: BC Managed Care – PPO | Admitting: Internal Medicine

## 2019-08-17 ENCOUNTER — Encounter: Payer: Self-pay | Admitting: Internal Medicine

## 2019-08-17 ENCOUNTER — Other Ambulatory Visit: Payer: Self-pay

## 2019-08-17 ENCOUNTER — Ambulatory Visit (INDEPENDENT_AMBULATORY_CARE_PROVIDER_SITE_OTHER): Payer: BC Managed Care – PPO | Admitting: Internal Medicine

## 2019-08-17 VITALS — BP 122/80 | HR 66 | Resp 12 | Ht 60.0 in | Wt 191.0 lb

## 2019-08-17 DIAGNOSIS — D649 Anemia, unspecified: Secondary | ICD-10-CM | POA: Diagnosis not present

## 2019-08-17 DIAGNOSIS — Z23 Encounter for immunization: Secondary | ICD-10-CM | POA: Diagnosis not present

## 2019-08-17 DIAGNOSIS — E119 Type 2 diabetes mellitus without complications: Secondary | ICD-10-CM | POA: Diagnosis not present

## 2019-08-17 LAB — GLUCOSE, POCT (MANUAL RESULT ENTRY): POC Glucose: 130 mg/dl — AB (ref 70–99)

## 2019-08-17 NOTE — Patient Instructions (Signed)
Get a flu shot today at [facility] when you pick up your meds (or sometime in next week) You were given a pneumococcal shot today, which is different.

## 2019-08-17 NOTE — Progress Notes (Signed)
    Subjective:    Patient ID: Sarah Owens, female   DOB: 11-12-1976, 43 y.o.   MRN: [id number]   HPI   Daughter, Sarah Owens, interprets  1.  Menorrhagia:  Never went for ultrasound.  Apparently was called and did not answer.  Found out today she did not have voicemail set up and does not answer the phone when at work M-F.  2.  Iron Deficiency Anemia:  Apparently did not pick up the Ferrous gluconate.    3.  DM:  Not taking Metformin ER.  States was told did not have any more refills.  Called the pharmacy and found out was not the case. She does have her diabetic supplies, but is not checking sugars as it hurts. Later obtained info from pharmacy she had picked up Metformin on 06/06/2019 and 04/11/2019, each for a month.   No outpatient medications have been marked as taking for the 08/17/19 encounter (Office Visit) with Mack Hook, MD.   No Known Allergies   Review of Systems    Objective:   BP 122/80 (BP Location: Left Arm, Patient Position: Sitting, Cuff Size: Normal)   Pulse 66   Resp 12   Ht 5' (1.524 m)   Wt 191 lb (86.6 kg)   LMP 07/23/2019   BMI 37.30 kg/m   Physical Exam  NAD Lungs:  CTA CV:  RRR without murmur or rub.  Radial and DP pulses normal and equal Abd:  S, NT, No HSM or mass, + BS LE:  No edema.   Assessment & Plan  1.  Menorrhagia with dysmenorrhea/mild anemia:  No problem recently. Hold on  ultrasound as asymptomatic now. CBC.  To get started with iron.  Discussed it is OTC--to ask pharmacist to help find.  2.  DM:  To get started back on Metformin ER.  Discussed someone should call if she cannot get her meds filled. Hold on A1C until she is actually taking meds for 3 months. Pneumococcal vaccine 23 v today. [facility] today--to show them her paperwork Follow up in 2 months.  3.  Compliance issues:  Long discussion regarding both patient and her husband and difficulties with getting to appts on  time,missing appts, taking meds. They state he tells them he will take his meds then forgets. Discussed would like for them to ask him to take them right then and not wait. Daughter, Sarah Owens, states they are often late as she has things to do.  Discussed in the future, they need to make appts that works for all of their schedules and not when she has something to do. Sounds like [name] needs to come after 4 p.m. with her work schedule and should ask for Wednesday evenings.

## 2019-08-18 LAB — CBC WITH DIFFERENTIAL/PLATELET
Basophils Absolute: 0 10*3/uL (ref 0.0–0.2)
Basos: 0 %
EOS (ABSOLUTE): 0.1 10*3/uL (ref 0.0–0.4)
Eos: 2 %
Hematocrit: 34.8 % (ref 34.0–46.6)
Hemoglobin: 11.1 g/dL (ref 11.1–15.9)
Immature Grans (Abs): 0 10*3/uL (ref 0.0–0.1)
Immature Granulocytes: 0 %
Lymphocytes Absolute: 1.7 10*3/uL (ref 0.7–3.1)
Lymphs: 37 %
MCH: 24.5 pg — ABNORMAL LOW (ref 26.6–33.0)
MCHC: 31.9 g/dL (ref 31.5–35.7)
MCV: 77 fL — ABNORMAL LOW (ref 79–97)
Monocytes Absolute: 0.4 10*3/uL (ref 0.1–0.9)
Monocytes: 8 %
Neutrophils Absolute: 2.5 10*3/uL (ref 1.4–7.0)
Neutrophils: 53 %
Platelets: 210 10*3/uL (ref 150–450)
RBC: 4.53 x10E6/uL (ref 3.77–5.28)
RDW: 15.5 % — ABNORMAL HIGH (ref 11.7–15.4)
WBC: 4.7 10*3/uL (ref 3.4–10.8)

## 2019-08-24 ENCOUNTER — Other Ambulatory Visit: Payer: Self-pay

## 2019-08-24 MED ORDER — METFORMIN HCL ER 500 MG PO TB24: ORAL_TABLET | ORAL | 11 refills | Status: DC

## 2019-10-01 ENCOUNTER — Encounter: Payer: Self-pay | Admitting: Internal Medicine

## 2019-10-26 ENCOUNTER — Other Ambulatory Visit: Payer: Self-pay

## 2019-10-26 ENCOUNTER — Ambulatory Visit (HOSPITAL_COMMUNITY)
Admission: EM | Admit: 2019-10-26 | Discharge: 2019-10-26 | Disposition: A | Discharge status: Home / Self Care | Payer: Medicaid Other | Attending: Family Medicine | Admitting: Family Medicine

## 2019-10-26 ENCOUNTER — Encounter (HOSPITAL_COMMUNITY): Payer: Self-pay | Admitting: Emergency Medicine

## 2019-10-26 DIAGNOSIS — U071 COVID-19: Secondary | ICD-10-CM | POA: Diagnosis not present

## 2019-10-26 LAB — POC SARS CORONAVIRUS 2 AG: SARS Coronavirus 2 Ag: POSITIVE — AB

## 2019-10-26 LAB — POCT RAPID STREP A: Streptococcus, Group A Screen (Direct): NEGATIVE

## 2019-10-26 LAB — POC SARS CORONAVIRUS 2 AG -  ED: SARS Coronavirus 2 Ag: POSITIVE — AB

## 2019-10-26 MED ORDER — ACETAMINOPHEN 325 MG PO TABS
650.0000 mg | ORAL_TABLET | Freq: Once | ORAL | Status: AC
  Administered 2019-10-26: 650 mg via ORAL

## 2019-10-26 MED ORDER — IBUPROFEN 800 MG PO TABS: 800.0000 mg | ORAL_TABLET | Freq: Three times a day (TID) | ORAL | 0 refills | Status: DC

## 2019-10-26 MED ORDER — ACETAMINOPHEN 325 MG PO TABS
ORAL_TABLET | ORAL | Status: AC
  Filled 2019-10-26: qty 2

## 2019-10-26 NOTE — ED Provider Notes (Signed)
[place]   [id number] 10/26/19 Arrival Time: 6568  ASSESSMENT & PLAN:  1. COVID-19 virus infection     See AVS for written COVID information given. Rapid strep test negative. Work note provided. No symptoms indicating need for hospital admission.  Meds ordered this encounter  Medications  . acetaminophen (TYLENOL) tablet 650 mg  . ibuprofen (ADVIL) 800 MG tablet    Sig: Take 1 tablet (800 mg total) by mouth 3 (three) times daily with meals.    Dispense:  21 tablet    Refill:  0     Follow-up Information    [place].   Specialty: Emergency Medicine Why: If symptoms worsen in any way. Contact information: [address] [id number] [place] [phone]          Reviewed expectations re: course of current medical issues. Questions answered. Outlined signs and symptoms indicating need for more acute intervention. Patient verbalized understanding. After Visit Summary given.   SUBJECTIVE: History from: patient. [place] interpreter used.  Sarah Owens is a 43 y.o. female who presents with headache, body aches, and fatigue. Questions subjective fever. Fairly abrupt onset of symptoms 2-3 d ago. Minimal dy cough. Known COVID-19 contact: none but works around many people at a [employer]. Recent travel: none. No OTC treatment. Normal PO intake without n/v. Normal bowel/bladder habits. No specific aggravating or alleviating factors reported.  ROS: As per HPI.   OBJECTIVE:  Vitals:   10/26/19 1249  BP: 129/89  Pulse: 69  Resp: (!) 22  Temp: (!) 101.7 F (38.7 C)  TempSrc: Oral  SpO2: 100%    Fever noted. Recheck RR: 18.  General appearance: alert; no distress but appears fatigued Eyes: PERRLA; EOMI; conjunctiva normal HENT: [place]; AT; mild nasal congestion Neck: supple  Lungs: unlabored; speaks full sentences without difficulty Heart: regular rate and rhythm Abdomen:  soft, non-tender Extremities: no edema Skin: warm and dry Neurologic: normal gait Psychological: alert and cooperative; normal mood and affect  Labs: Results for orders placed or performed during the hospital encounter of 10/26/19  POC SARS Coronavirus 2 Ag-ED -  Result Value Ref Range   SARS Coronavirus 2 Ag POSITIVE (A) NEGATIVE  POCT rapid strep A [facility])  Result Value Ref Range   Streptococcus, Group A Screen (Direct) NEGATIVE NEGATIVE  POC SARS Coronavirus 2 Ag  Result Value Ref Range   SARS Coronavirus 2 Ag POSITIVE (A) NEGATIVE   Labs Reviewed  POC SARS CORONAVIRUS 2 AG -  ED - Abnormal; Notable for the following components:      Result Value   SARS Coronavirus 2 Ag POSITIVE (*)    All other components within normal limits  POC SARS CORONAVIRUS 2 AG - Abnormal; Notable for the following components:   SARS Coronavirus 2 Ag POSITIVE (*)    All other components within normal limits  CULTURE, GROUP A STREP [facility])  POCT RAPID STREP A     No Known Allergies  Past Medical History:  Diagnosis Date  . No pertinent past medical history    Social History   Socioeconomic History  . Marital status: Married    Spouse name: Osie Cheeks  . Number of children: 5  . Years of education: 8  . Highest education level: 8th grade  Occupational History  . Not on file  Social Needs  . Financial resource strain: Not on file  . Food insecurity    Worry: Never true  Inability: Never true  . Transportation needs    Medical: No    Non-medical: No  Tobacco Use  . Smoking status: Never Smoker  . Smokeless tobacco: Never Used  Substance and Sexual Activity  . Alcohol use: No  . Drug use: No  . Sexual activity: Yes    Birth control/protection: Surgical  Lifestyle  . Physical activity    Days per week: Not on file    Minutes per session: Not on file  . Stress: Not on file  Relationships  . Social Musician on phone: Not on file    Gets together: Not  on file    Attends religious service: Never    Active member of club or organization: Not on file    Attends meetings of clubs or organizations: Not on file    Relationship status: Not on file  . Intimate partner violence    Fear of current or ex partner: Not on file    Emotionally abused: Not on file    Physically abused: Not on file    Forced sexual activity: Not on file  Other Topics Concern  . Not on file  Social History Narrative   Lives at home with husband, Ty Hilts (listed as Ty Hilts), who has significant health issues, as well as her 5 children and some older adult step children.         Family History  Problem Relation Age of Onset  . Pneumonia Son    Past Surgical History:  Procedure Laterality Date  . CESAREAN SECTION N/A 08/22/2014   Procedure: CESAREAN SECTION;  Surgeon: Philip Aspen, DO;  Location: [facility];  Service: Obstetrics;  Laterality: N/A;, for breech presentation.  . TUBAL LIGATION Bilateral 08/22/2014   Procedure: BILATERAL TUBAL LIGATION;  Surgeon: Philip Aspen, DO;  Location: [facility];  Service: Obstetrics;  Laterality: Bilateral;  . WISDOM TOOTH EXTRACTION       Mardella Layman, MD 10/26/19 1340

## 2019-10-26 NOTE — ED Triage Notes (Signed)
Headache, body aches, minimal cough.  Patient reports fever, chills, sore throat

## 2019-10-28 LAB — CULTURE, GROUP A STREP (THRC)

## 2019-11-14 ENCOUNTER — Telehealth: Payer: Self-pay | Admitting: Internal Medicine

## 2019-11-14 NOTE — Telephone Encounter (Signed)
Called pt. To confirm appt. Scheduled for 11/16/2019; after going through with screening  covid-19 questions patient confirmed  headache x a week. Looked into patient's chart noticed on 10/26/2019 patient tested positive. Pt. Rescheduled for 12/20/2019.   Sharing information with Maurice Small to contact Tracer.

## 2019-11-14 NOTE — Telephone Encounter (Signed)
Contacted Beverlee Nims to give her the information to assign a Contact Tracer.

## 2019-11-16 ENCOUNTER — Ambulatory Visit: Payer: BC Managed Care – PPO | Admitting: Internal Medicine

## 2019-12-20 ENCOUNTER — Ambulatory Visit: Payer: Medicaid Other | Admitting: Internal Medicine

## 2020-04-26 ENCOUNTER — Other Ambulatory Visit: Payer: Self-pay

## 2020-04-26 ENCOUNTER — Ambulatory Visit (HOSPITAL_COMMUNITY)
Admission: EM | Admit: 2020-04-26 | Discharge: 2020-04-26 | Disposition: A | Discharge status: Home / Self Care | Payer: Medicaid Other | Attending: Physician Assistant | Admitting: Physician Assistant

## 2020-04-26 ENCOUNTER — Encounter (HOSPITAL_COMMUNITY): Payer: Self-pay

## 2020-04-26 DIAGNOSIS — R001 Bradycardia, unspecified: Secondary | ICD-10-CM | POA: Diagnosis present

## 2020-04-26 DIAGNOSIS — Z862 Personal history of diseases of the blood and blood-forming organs and certain disorders involving the immune mechanism: Secondary | ICD-10-CM | POA: Insufficient documentation

## 2020-04-26 DIAGNOSIS — R002 Palpitations: Secondary | ICD-10-CM | POA: Diagnosis not present

## 2020-04-26 LAB — CBC
HCT: 43.5 % (ref 36.0–46.0)
Hemoglobin: 14.1 g/dL (ref 12.0–15.0)
MCH: 29.4 pg (ref 26.0–34.0)
MCHC: 32.4 g/dL (ref 30.0–36.0)
MCV: 90.8 fL (ref 80.0–100.0)
Platelets: 196 10*3/uL (ref 150–400)
RBC: 4.79 MIL/uL (ref 3.87–5.11)
RDW: 13 % (ref 11.5–15.5)
WBC: 4.8 10*3/uL (ref 4.0–10.5)
nRBC: 0 % (ref 0.0–0.2)

## 2020-04-26 LAB — TSH: TSH: 2.454 u[IU]/mL (ref 0.350–4.500)

## 2020-04-26 NOTE — ED Triage Notes (Signed)
Pt reports palpitations and fatigue x 2 days. After the palpation pt feels her shoulders are heavy.

## 2020-04-26 NOTE — ED Provider Notes (Signed)
[place]   MRN: [id number] DOB: 02-20-1976  Subjective:   Sarah Owens is a 44 y.o. female presenting for 4-day history of persistent fatigue, palpitations.  Patient has a history of mild anemia, diabetes and is managed by her PCP.  Denies fever, headache, confusion, chest pain, shortness of breath, nausea, vomiting, belly pain.  Denies history of heart conditions, arrhythmias.  No current facility-administered medications for this encounter.  Current Outpatient Medications:  .  Accu-Chek Softclix Lancets lancets, Check blood glucose twice daily before meals. (Patient not taking: Reported on 08/17/2019), Disp: 100 each, Rfl: 11 .  Blood Glucose Monitoring Suppl (ACCU-CHEK AVIVA PLUS) w/Device KIT, Check blood glucose twice daily before meals (Patient not taking: Reported on 06/06/2019), Disp: 1 kit, Rfl: 0 .  ferrous gluconate (FERGON) 324 MG tablet, Take 324 mg by mouth daily with breakfast., Disp: , Rfl:  .  glucose blood (ACCU-CHEK AVIVA PLUS) test strip, Check blood glucose twice daily before meals (Patient not taking: Reported on 06/06/2019), Disp: 100 each, Rfl: 12 .  ibuprofen (ADVIL) 800 MG tablet, Take 1 tablet (800 mg total) by mouth 3 (three) times daily with meals., Disp: 21 tablet, Rfl: 0 .  metFORMIN (GLUCOPHAGE-XR) 500 MG 24 hr tablet, 2 tabs by mouth with breakfast daily., Disp: 60 tablet, Rfl: 11   No Known Allergies  Past Medical History:  Diagnosis Date  . No pertinent past medical history      Past Surgical History:  Procedure Laterality Date  . CESAREAN SECTION N/A 08/22/2014   Procedure: CESAREAN SECTION;  Surgeon: Allyn Kenner, DO;  Location: [facility];  Service: Obstetrics;  Laterality: N/A;, for breech presentation.  . TUBAL LIGATION Bilateral 08/22/2014   Procedure: BILATERAL TUBAL LIGATION;  Surgeon: Allyn Kenner, DO;  Location: [facility];  Service: Obstetrics;  Laterality: Bilateral;  . WISDOM TOOTH EXTRACTION      Family History  Problem Relation Age  of Onset  . Pneumonia Son     Social History   Tobacco Use  . Smoking status: Never Smoker  . Smokeless tobacco: Never Used  Substance Use Topics  . Alcohol use: No  . Drug use: No    ROS   Objective:   Vitals: BP 130/63 (BP Location: Right Arm)   Pulse 60   Temp 98.6 F (37 C) (Oral)   Resp 20   LMP 04/21/2020 (Exact Date)   SpO2 100%   Physical Exam Constitutional:      General: She is not in acute distress.    Appearance: Normal appearance. She is well-developed. She is not ill-appearing, toxic-appearing or diaphoretic.  HENT:     Head: Normocephalic and atraumatic.     Nose: Nose normal.     Mouth/Throat:     Mouth: Mucous membranes are moist.  Eyes:     Extraocular Movements: Extraocular movements intact.     Pupils: Pupils are equal, round, and reactive to light.  Cardiovascular:     Rate and Rhythm: Normal rate and regular rhythm.     Pulses: Normal pulses.     Heart sounds: Normal heart sounds. No murmur. No friction rub. No gallop.   Pulmonary:     Effort: Pulmonary effort is normal. No respiratory distress.     Breath sounds: Normal breath sounds. No stridor. No wheezing, rhonchi or rales.  Skin:    General: Skin is warm and dry.     Findings: No rash.  Neurological:     Mental Status: She is alert and oriented to  person, place, and time.  Psychiatric:        Mood and Affect: Mood normal.        Behavior: Behavior normal.        Thought Content: Thought content normal.        Judgment: Judgment normal.     ED ECG REPORT   Date: 04/26/2020  Rate: 51bpm  Rhythm: sinus bradycardia  QRS Axis: normal  Intervals: normal  ST/T Wave abnormalities: nonspecific T wave changes  Conduction Disutrbances:none  Narrative Interpretation: Sinus bradycardia with nonspecific T wave flattening in V1 through V6.  Very comparable and unchanged from previous EKG on 09/19/2012.  Old EKG Reviewed: none available  I have personally reviewed the EKG tracing and  agree with the computerized printout as noted.   Assessment and Plan :   PDMP not reviewed this encounter.  1. Palpitations   2. Bradycardia   3. History of anemia     Labs pending, patient would benefit from consultation with a cardiologist.  Information provided to the patient.  Advised patient that I would contact her if her labs were abnormal but otherwise recommended she follow-up with her PCP ASAP. Counseled patient on potential for adverse effects with medications prescribed/recommended today, ER and return-to-clinic precautions discussed, patient verbalized understanding.    Jaynee Eagles, [place] 04/26/20 6837

## 2020-07-09 ENCOUNTER — Encounter (HOSPITAL_COMMUNITY): Payer: Self-pay | Admitting: Emergency Medicine

## 2020-07-09 ENCOUNTER — Other Ambulatory Visit: Payer: Self-pay

## 2020-07-09 ENCOUNTER — Emergency Department (HOSPITAL_COMMUNITY)
Admission: EM | Admit: 2020-07-09 | Discharge: 2020-07-10 | Disposition: A | Discharge status: Home / Self Care | Payer: Medicaid Other | Attending: Emergency Medicine | Admitting: Emergency Medicine

## 2020-07-09 DIAGNOSIS — N12 Tubulo-interstitial nephritis, not specified as acute or chronic: Secondary | ICD-10-CM

## 2020-07-09 DIAGNOSIS — R1030 Lower abdominal pain, unspecified: Secondary | ICD-10-CM | POA: Diagnosis not present

## 2020-07-09 DIAGNOSIS — R300 Dysuria: Secondary | ICD-10-CM | POA: Insufficient documentation

## 2020-07-09 DIAGNOSIS — R10815 Periumbilic abdominal tenderness: Secondary | ICD-10-CM | POA: Diagnosis not present

## 2020-07-09 DIAGNOSIS — R10813 Right lower quadrant abdominal tenderness: Secondary | ICD-10-CM | POA: Insufficient documentation

## 2020-07-09 DIAGNOSIS — Z7984 Long term (current) use of oral hypoglycemic drugs: Secondary | ICD-10-CM | POA: Diagnosis not present

## 2020-07-09 DIAGNOSIS — E119 Type 2 diabetes mellitus without complications: Secondary | ICD-10-CM | POA: Insufficient documentation

## 2020-07-09 DIAGNOSIS — R10811 Right upper quadrant abdominal tenderness: Secondary | ICD-10-CM | POA: Diagnosis not present

## 2020-07-09 DIAGNOSIS — R109 Unspecified abdominal pain: Secondary | ICD-10-CM | POA: Insufficient documentation

## 2020-07-09 LAB — COMPREHENSIVE METABOLIC PANEL
ALT: 14 U/L (ref 0–44)
AST: 14 U/L — ABNORMAL LOW (ref 15–41)
Albumin: 3.3 g/dL — ABNORMAL LOW (ref 3.5–5.0)
Alkaline Phosphatase: 76 U/L (ref 38–126)
Anion gap: 8 (ref 5–15)
BUN: 9 mg/dL (ref 6–20)
CO2: 25 mmol/L (ref 22–32)
Calcium: 10.5 mg/dL — ABNORMAL HIGH (ref 8.9–10.3)
Chloride: 101 mmol/L (ref 98–111)
Creatinine, Ser: 0.9 mg/dL (ref 0.44–1.00)
GFR calc Af Amer: >60 mL/min (ref >60)
GFR calc non Af Amer: >60 mL/min (ref >60)
Glucose, Bld: 235 mg/dL — ABNORMAL HIGH (ref 70–99)
Potassium: 4.3 mmol/L (ref 3.5–5.1)
Sodium: 134 mmol/L — ABNORMAL LOW (ref 135–145)
Total Bilirubin: 0.6 mg/dL (ref 0.3–1.2)
Total Protein: 7.8 g/dL (ref 6.5–8.1)

## 2020-07-09 LAB — URINALYSIS, ROUTINE W REFLEX MICROSCOPIC
Bilirubin Urine: NEGATIVE
Glucose, UA: >=500 mg/dL — AB
Ketones, ur: NEGATIVE mg/dL
Nitrite: NEGATIVE
Protein, ur: 100 mg/dL — AB
RBC / HPF: >50 RBC/hpf — ABNORMAL HIGH (ref 0–5)
Specific Gravity, Urine: 1.021 (ref 1.005–1.030)
WBC, UA: >50 WBC/hpf — ABNORMAL HIGH (ref 0–5)
pH: 7 (ref 5.0–8.0)

## 2020-07-09 LAB — I-STAT BETA HCG BLOOD, ED (MC, WL, AP ONLY): I-stat hCG, quantitative: <5 m[IU]/mL (ref <5)

## 2020-07-09 LAB — CBC
HCT: 40 % (ref 36.0–46.0)
Hemoglobin: 12.8 g/dL (ref 12.0–15.0)
MCH: 28.8 pg (ref 26.0–34.0)
MCHC: 32 g/dL (ref 30.0–36.0)
MCV: 89.9 fL (ref 80.0–100.0)
Platelets: 198 10*3/uL (ref 150–400)
RBC: 4.45 MIL/uL (ref 3.87–5.11)
RDW: 12.3 % (ref 11.5–15.5)
WBC: 6.7 10*3/uL (ref 4.0–10.5)
nRBC: 0 % (ref 0.0–0.2)

## 2020-07-09 LAB — LIPASE, BLOOD: Lipase: 24 U/L (ref 11–51)

## 2020-07-09 MED ORDER — SODIUM CHLORIDE 0.9% FLUSH
3.0000 mL | Freq: Once | INTRAVENOUS | Status: AC
  Administered 2020-07-10: 3 mL via INTRAVENOUS

## 2020-07-09 NOTE — ED Triage Notes (Signed)
Patient reports right lateral abdominal pain radiating to right flank onset this week , denies emesis or diaphoresis . No fever or chills .

## 2020-07-10 ENCOUNTER — Emergency Department (HOSPITAL_COMMUNITY): Payer: Medicaid Other

## 2020-07-10 MED ORDER — SODIUM CHLORIDE 0.9 % IV BOLUS
500.0000 mL | Freq: Once | INTRAVENOUS | Status: AC
  Administered 2020-07-10: 500 mL via INTRAVENOUS

## 2020-07-10 MED ORDER — SODIUM CHLORIDE 0.9 % IV SOLN
1.0000 g | Freq: Once | INTRAVENOUS | Status: AC
  Administered 2020-07-10: 1 g via INTRAVENOUS
  Filled 2020-07-10: qty 10

## 2020-07-10 MED ORDER — ACETAMINOPHEN 325 MG PO TABS
650.0000 mg | ORAL_TABLET | Freq: Once | ORAL | Status: AC
  Administered 2020-07-10: 650 mg via ORAL
  Filled 2020-07-10 (×2): qty 2

## 2020-07-10 MED ORDER — CEPHALEXIN 500 MG PO CAPS: 500.0000 mg | ORAL_CAPSULE | Freq: Three times a day (TID) | ORAL | 0 refills | Status: AC

## 2020-07-10 MED ORDER — KETOROLAC TROMETHAMINE 30 MG/ML IJ SOLN
15.0000 mg | Freq: Once | INTRAMUSCULAR | Status: AC
  Administered 2020-07-10: 15 mg via INTRAVENOUS
  Filled 2020-07-10: qty 1

## 2020-07-10 NOTE — ED Provider Notes (Signed)
[facility] EMERGENCY DEPARTMENT Provider Note   CSN: [id number] Arrival date & time: 07/09/20  2053   History Chief Complaint  Patient presents with   Abdominal Pain   Sarah Owens is a 44 year old female with past medical history significant for T2DM (on metformin) who presents for evaluation of abdominal pain. She states that approximately one week ago, she developed the gradual onset of right-sided abdominal pain that radiates to her right flank. She states that since its onset, the pain has progressively worsened, and rates the pain as an 8/10 in severity. The pain is mildly improved with tylenol and ibuprofen, and aggravated by urination. She has been able to maintain adequate hydration during this time. Associated symptoms include increased urinary frequency, urgency, and headache; however she denies associated fever, chills, cough, shortness of breath, nausea, vomiting, or diarrhea.  *Ms. Kilbride speaks Swahili, therefore a [employer] interpreter provided translation for this encounter.    Past Medical History: Type 2 Diabetes Mellitus  Patient Active Problem List   Diagnosis Date Noted   Problem with medical care compliance 06/06/2019   Abdominal pain 06/06/2019   Type 2 diabetes mellitus without complication, without long-term current use of insulin ([place]) 06/06/2019   Class 2 obesity with body mass index (BMI) of 37.0 to 37.9 in adult 04/05/2019   Menorrhagia with regular cycle 04/05/2019   Cesarean delivery delivered 08/22/2014   Past Surgical History:  Procedure Laterality Date   CESAREAN SECTION N/A 08/22/2014   Procedure: CESAREAN SECTION;  Surgeon: Allyn Kenner, DO;  Location: [facility];  Service: Obstetrics;  Laterality: N/A;, for breech presentation.   TUBAL LIGATION Bilateral 08/22/2014   Procedure: BILATERAL TUBAL LIGATION;  Surgeon: Allyn Kenner, DO;  Location: [facility];  Service: Obstetrics;  Laterality: Bilateral;   WISDOM TOOTH EXTRACTION      OB History    Gravida  8   Para  8   Term  8   Preterm  0   AB  0   Living  5     SAB  0   TAB  0   Ectopic  0   Multiple  0   Live Births  6          Family History  Problem Relation Age of Onset   Pneumonia Son    Social History   Tobacco Use   Smoking status: Never Smoker   Smokeless tobacco: Never Used  Vaping Use   Vaping Use: Never used  Substance Use Topics   Alcohol use: No   Drug use: No   Home Medications Prior to Admission medications   Medication Sig Start Date End Date Taking? Authorizing Provider  acetaminophen (TYLENOL) 500 MG tablet Take 500 mg by mouth every 6 (six) hours as needed for mild pain or headache.   Yes [provider]  ibuprofen (ADVIL) 800 MG tablet Take 1 tablet (800 mg total) by mouth 3 (three) times daily with meals. 10/26/19  Yes Hagler, Aaron Edelman, MD  Accu-Chek Softclix Lancets lancets Check blood glucose twice daily before meals. Patient not taking: Reported on 08/17/2019 06/06/19   Mack Hook, MD  Blood Glucose Monitoring Suppl (ACCU-CHEK AVIVA PLUS) w/Device KIT Check blood glucose twice daily before meals Patient not taking: Reported on 06/06/2019 04/11/19   Mack Hook, MD  glucose blood (ACCU-CHEK AVIVA PLUS) test strip Check blood glucose twice daily before meals Patient not taking: Reported on 06/06/2019 04/11/19   Mack Hook, MD  metFORMIN (GLUCOPHAGE-XR) 500 MG 24 hr  tablet 2 tabs by mouth with breakfast daily. Patient not taking: Reported on 07/10/2020 08/24/19   Mack Hook, MD   Allergies    Patient has no known allergies.  Review of Systems   Review of Systems  Constitutional: Negative for chills and fever.  HENT: Negative for ear pain and sore throat.   Eyes: Negative for pain and visual disturbance.  Respiratory: Negative for cough and shortness of breath.   Cardiovascular: Negative for chest pain and palpitations.  Gastrointestinal: Positive for abdominal  pain. Negative for diarrhea, nausea and vomiting.  Genitourinary: Positive for dysuria, frequency, pelvic pain and urgency. Negative for hematuria.  Musculoskeletal: Negative for arthralgias and back pain.  Skin: Negative for color change and rash.  Neurological: Negative for seizures and syncope.  All other systems reviewed and are negative.   Physical Exam Updated Vital Signs BP 112/63    Pulse 75    Temp 99.4 F (37.4 C) (Oral)    Resp 20    Ht '5\' 6"'$  (1.676 m)    Wt 110 kg    LMP 06/21/2020    SpO2 98%    BMI 39.14 kg/m   Physical Exam Vitals and nursing note reviewed.  Constitutional:      General: She is not in acute distress.    Appearance: She is well-developed.  HENT:     Head: Normocephalic and atraumatic.     Mouth/Throat:     Mouth: Mucous membranes are moist.     Pharynx: Oropharynx is clear.  Eyes:     Extraocular Movements: Extraocular movements intact.     Conjunctiva/sclera: Conjunctivae normal.     Pupils: Pupils are equal, round, and reactive to light.  Cardiovascular:     Rate and Rhythm: Normal rate and regular rhythm.     Heart sounds: Normal heart sounds. No murmur heard.   Pulmonary:     Effort: Pulmonary effort is normal. No respiratory distress.     Breath sounds: Normal breath sounds.  Abdominal:     General: Abdomen is flat. Bowel sounds are normal.     Palpations: Abdomen is soft.     Tenderness: There is abdominal tenderness in the right upper quadrant, right lower quadrant, periumbilical area and suprapubic area. There is right CVA tenderness. There is no left CVA tenderness, guarding or rebound.  Musculoskeletal:     Cervical back: Neck supple.  Skin:    General: Skin is warm and dry.     Capillary Refill: Capillary refill takes less than 2 seconds.  Neurological:     General: No focal deficit present.     Mental Status: She is alert and oriented to person, place, and time.  Psychiatric:        Mood and Affect: Mood normal.         Behavior: Behavior normal.    ED Results / Procedures / Treatments   Labs (all labs ordered are listed, but only abnormal results are displayed) Labs Reviewed  COMPREHENSIVE METABOLIC PANEL - Abnormal; Notable for the following components:      Result Value   Sodium 134 (*)    Glucose, Bld 235 (*)    Calcium 10.5 (*)    Albumin 3.3 (*)    AST 14 (*)    All other components within normal limits  URINALYSIS, ROUTINE W REFLEX MICROSCOPIC - Abnormal; Notable for the following components:   APPearance CLOUDY (*)    Glucose, UA >=500 (*)    Hgb urine dipstick SMALL (*)  Protein, ur 100 (*)    Leukocytes,Ua LARGE (*)    RBC / HPF >50 (*)    WBC, UA >50 (*)    Bacteria, UA MANY (*)    All other components within normal limits  LIPASE, BLOOD  CBC  I-STAT BETA HCG BLOOD, ED ([facility], [facility], [facility] ONLY)   EKG None  Radiology No results found.  Procedures Procedures (including critical care time)  Medications Ordered in ED Medications  cefTRIAXone (ROCEPHIN) 1 g in sodium chloride 0.9 % 100 mL IVPB (1 g Intravenous New Bag/Given 07/10/20 0907)  sodium chloride flush (NS) 0.9 % injection 3 mL (3 mLs Intravenous Given 07/10/20 0906)  acetaminophen (TYLENOL) tablet 650 mg (650 mg Oral Given 07/10/20 0908)  sodium chloride 0.9 % bolus 500 mL (500 mLs Intravenous New Bag/Given 07/10/20 0906)  ketorolac (TORADOL) 30 MG/ML injection 15 mg (15 mg Intravenous Given 07/10/20 8099)   ED Course  I have reviewed the triage vital signs and the nursing notes.  Pertinent labs & imaging results that were available during my care of the patient were reviewed by me and considered in my medical decision making (see chart for details).   MDM Rules/Calculators/A&P                          Ms. Yaritzi Craun is a 44 year old woman with past medical history of T2DM who presented for evaluation of abdominal pain. Her right-sided abdominal pain is associated with dysuria, urinary urgency/frequency and right-sided flank  pain. On arrival to the ED, she was noted to be febrile to 103.0, but otherwise hemodynamically stable. She exhibits significant right-sided abdominal tenderness and right-sided CVA tenderness on physical examination. Her initial labs demonstrate hyperglycemia to 235, hypercalcemia 10.5, and urinalysis remarkable for pyuria (WBC >50) and bacteriuria (many); CBC, lipase and beta-HCG were unremarkable. Her presentation is most consistent with acute pyelonephritis, however associated perinephric abscess and renal stone are possible complicating factors given the severity and duration of her pain. Alternative etiologies for her pain were considered including an underlying gastrointestinal illness such as acute appendicitis, acute cholecystitis, pancreatitis, however her constellation of symptoms and initial laboratory workup demonstrated that these causes for abdominal pain were less likely.   While in the ED, Ms. Shull initially received 1g rocephin (antibiogram reveals E. Coli 92% susceptible to ceftriaxone), '15mg'$  IV toradol for pain control (kidney function is normal), '650mg'$  tylenol for fever/pain control, and 500cc bolus of normal saline. CT Renal Joaquim Lai Study is currently pending to evaluate for complicating factors.   Final Clinical Impression(s) / ED Diagnoses Final diagnoses:  None   Rx / DC Orders ED Discharge Orders    None       Paulla Dolly, MD 07/10/20 8338    Carmin Muskrat, MD 07/10/20 [phone]

## 2020-07-10 NOTE — Discharge Instructions (Signed)
Pyelonephritis ni maambukizi ambayo hutokea kwenye figo. Figo ni viungo ambavyo huchuja damu ya mtu na kusogeza taka kutoka kwenye mfumo wa damu na kwenye mkojo. Mkojo hupita kutoka kwenye figo, [place] mirija inayoitwa ureters, na kwenye kibofu cha mkojo.  Return here for any concerning changes in your condition.

## 2020-07-10 NOTE — ED Notes (Signed)
Triage RN notified of temp 

## 2020-07-20 ENCOUNTER — Telehealth: Payer: Self-pay

## 2020-07-20 NOTE — Telephone Encounter (Signed)
noted 

## 2020-07-20 NOTE — Telephone Encounter (Signed)
Left a detailed message for Mr. Sarah Owens that patient needs to come in and leave a urine sample so we can recheck her urine to make sure infection is cleared per Dr. Delrae Alfred. Nothing in patient chart states patient needs to be on antibiotics for 3 months. Informed will have Britt Boozer contact husband and get patient on the books for UA.  To Britt Boozer to schedule appointment

## 2020-07-20 NOTE — Telephone Encounter (Signed)
Patient scheduled for 07/23/20 @ 2:30 pm. Spoke with Mr. Dalia Heading.

## 2020-07-23 ENCOUNTER — Other Ambulatory Visit: Payer: Medicaid Other

## 2020-08-28 ENCOUNTER — Ambulatory Visit: Payer: Medicaid Other | Admitting: Internal Medicine

## 2021-01-29 ENCOUNTER — Ambulatory Visit (HOSPITAL_COMMUNITY)
Admission: EM | Admit: 2021-01-29 | Discharge: 2021-01-29 | Disposition: A | Discharge status: Home / Self Care | Payer: Medicaid Other | Attending: Internal Medicine | Admitting: Internal Medicine

## 2021-01-29 ENCOUNTER — Encounter (HOSPITAL_COMMUNITY): Payer: Self-pay | Admitting: Emergency Medicine

## 2021-01-29 ENCOUNTER — Other Ambulatory Visit: Payer: Self-pay

## 2021-01-29 DIAGNOSIS — B3731 Acute candidiasis of vulva and vagina: Secondary | ICD-10-CM

## 2021-01-29 DIAGNOSIS — R21 Rash and other nonspecific skin eruption: Secondary | ICD-10-CM | POA: Diagnosis not present

## 2021-01-29 DIAGNOSIS — B373 Candidiasis of vulva and vagina: Secondary | ICD-10-CM | POA: Insufficient documentation

## 2021-01-29 HISTORY — DX: Type 2 diabetes mellitus without complications: E11.9

## 2021-01-29 LAB — CBG MONITORING, ED
Glucose-Capillary: 166 mg/dL — ABNORMAL HIGH (ref 70–99)
Glucose-Capillary: 167 mg/dL — ABNORMAL HIGH (ref 70–99)

## 2021-01-29 LAB — POCT URINALYSIS DIPSTICK, ED / UC
Bilirubin Urine: NEGATIVE
Glucose, UA: >=1000 mg/dL — AB
Hgb urine dipstick: NEGATIVE
Nitrite: NEGATIVE
Protein, ur: NEGATIVE mg/dL
Specific Gravity, Urine: 1.025 (ref 1.005–1.030)
Urobilinogen, UA: 1 mg/dL (ref 0.0–1.0)
pH: 7 (ref 5.0–8.0)

## 2021-01-29 MED ORDER — FLUCONAZOLE 150 MG PO TABS: 150.0000 mg | ORAL_TABLET | Freq: Every day | ORAL | 0 refills | Status: DC

## 2021-01-29 MED ORDER — METFORMIN HCL ER 500 MG PO TB24: ORAL_TABLET | ORAL | 1 refills | Status: DC

## 2021-01-29 NOTE — ED Triage Notes (Signed)
Patient has burning with urination, genital burning and itchiness that started in December 2021  No abdominal pain, no back pain.  Denies vaginal discharge

## 2021-01-29 NOTE — Discharge Instructions (Addendum)
Take one antifungal pill today, can take second pill after 3 days if still having symptoms 2. Restart diabetes medications, take 2 pills every day at breakfast time 3. You will be called to get help with finding a primary care doctor, in the meantime reach out again to schedule appointment with current doctor 4. Follow up if no improvement of symptoms or symptoms worsen 5. Remaining labs will result in 2-3 days, you will be called if positive so that you may receive treatment

## 2021-01-29 NOTE — ED Provider Notes (Addendum)
[place]    CSN: [id number] Arrival date & time: 01/29/21  1114      History   Chief Complaint Chief Complaint  Patient presents with   Rash    HPI Sarah Owens is a 45 y.o. female.   Patient presents with dysuria, itching and vaginal irritation since December 2021. Incontinent episode once. Denies abdominal pain, flank pain, back pain, discharge, odor, changes in bowel habits. History of DM, stopped taking medications.  Swahili interpreter used for the entirety of exam    Past Medical History:  Diagnosis Date   Diabetes mellitus without complication ([place])    No pertinent past medical history     Patient Active Problem List   Diagnosis Date Noted   Problem with medical care compliance 06/06/2019   Abdominal pain 06/06/2019   Type 2 diabetes mellitus without complication, without long-term current use of insulin ([place]) 06/06/2019   Class 2 obesity with body mass index (BMI) of 37.0 to 37.9 in adult 04/05/2019   Menorrhagia with regular cycle 04/05/2019   Cesarean delivery delivered 08/22/2014    Past Surgical History:  Procedure Laterality Date   CESAREAN SECTION N/A 08/22/2014   Procedure: CESAREAN SECTION;  Surgeon: Allyn Kenner, DO;  Location: [facility];  Service: Obstetrics;  Laterality: N/A;, for breech presentation.   TUBAL LIGATION Bilateral 08/22/2014   Procedure: BILATERAL TUBAL LIGATION;  Surgeon: Allyn Kenner, DO;  Location: [facility];  Service: Obstetrics;  Laterality: Bilateral;   WISDOM TOOTH EXTRACTION      OB History    Gravida  8   Para  8   Term  8   Preterm  0   AB  0   Living  5     SAB  0   IAB  0   Ectopic  0   Multiple  0   Live Births  6            Home Medications    Prior to Admission medications   Medication Sig Start Date End Date Taking? Authorizing Provider  fluconazole (DIFLUCAN) 150 MG tablet Take 1 tablet (150 mg total) by mouth daily. 01/29/21  Yes Trevar Boehringer R, NP  Accu-Chek  Softclix Lancets lancets Check blood glucose twice daily before meals. Patient not taking: Reported on 08/17/2019 06/06/19   Mack Hook, MD  acetaminophen (TYLENOL) 500 MG tablet Take 500 mg by mouth every 6 (six) hours as needed for mild pain or headache.    [provider]  Blood Glucose Monitoring Suppl (ACCU-CHEK AVIVA PLUS) w/Device KIT Check blood glucose twice daily before meals Patient not taking: Reported on 06/06/2019 04/11/19   Mack Hook, MD  glucose blood (ACCU-CHEK AVIVA PLUS) test strip Check blood glucose twice daily before meals Patient not taking: Reported on 06/06/2019 04/11/19   Mack Hook, MD  ibuprofen (ADVIL) 800 MG tablet Take 1 tablet (800 mg total) by mouth 3 (three) times daily with meals. 10/26/19   Vanessa Kick, MD  metFORMIN (GLUCOPHAGE-XR) 500 MG 24 hr tablet 2 tabs by mouth with breakfast daily. 01/29/21   Hans Eden, NP    Family History Family History  Problem Relation Age of Onset   Pneumonia Son     Social History Social History   Tobacco Use   Smoking status: Never Smoker   Smokeless tobacco: Never Used  Scientific laboratory technician Use: Never used  Substance Use Topics   Alcohol use: No   Drug use: No  Allergies   Patient has no known allergies.   Review of Systems Review of Systems  Constitutional: Negative.   HENT: Negative.   Respiratory: Negative.   Cardiovascular: Negative.   Gastrointestinal: Negative.   Genitourinary: Positive for dysuria and frequency. Negative for decreased urine volume, difficulty urinating, dyspareunia, enuresis, flank pain, genital sores, hematuria, menstrual problem, pelvic pain, urgency, vaginal bleeding, vaginal discharge and vaginal pain.  Musculoskeletal: Negative.   Skin: Positive for rash. Negative for color change, pallor and wound.  Neurological: Negative.   Psychiatric/Behavioral: Negative.      Physical Exam Triage Vital Signs ED Triage Vitals  Enc Vitals  Group     BP 01/29/21 1233 (!) 141/96     Pulse Rate 01/29/21 1233 60     Resp 01/29/21 1233 18     Temp 01/29/21 1233 97.9 F (36.6 C)     Temp Source 01/29/21 1233 Oral     SpO2 01/29/21 1233 100 %     Weight --      Height --      Head Circumference --      Peak Flow --      Pain Score 01/29/21 1228 8     Pain Loc --      Pain Edu? --      Excl. in [place]? --    No data found.  Updated Vital Signs BP 133/81 (BP Location: Right Arm) Comment: repositioned arm   Pulse 60    Temp 97.9 F (36.6 C) (Oral)    Resp 18    LMP 01/26/2021    SpO2 100%   Visual Acuity Right Eye Distance:   Left Eye Distance:   Bilateral Distance:    Right Eye Near:   Left Eye Near:    Bilateral Near:     Physical Exam Constitutional:      Appearance: Normal appearance. She is obese.  HENT:     Head: Normocephalic.  Pulmonary:     Effort: Pulmonary effort is normal.  Abdominal:     General: Abdomen is flat. Bowel sounds are normal.     Palpations: Abdomen is soft.  Genitourinary:    General: Normal vulva.     Exam position: Lithotomy position.     Pubic Area: No rash.      Labia:        Right: Tenderness present. No rash, lesion or injury.        Left: Tenderness present. No rash or injury.      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  Skin:    General: Skin is warm and dry.  Neurological:     General: No focal deficit present.     Mental Status: She is alert and oriented to person, place, and time. Mental status is at baseline.  Psychiatric:        Mood and Affect: Mood normal.        Behavior: Behavior normal.        Thought Content: Thought content normal.        Judgment: Judgment normal.      UC Treatments / Results  Labs (all labs ordered are listed, but only abnormal results are displayed) Labs Reviewed  POCT URINALYSIS DIPSTICK, ED / UC - Abnormal; Notable for the following  components:      Result Value   Glucose, UA >=1000 (*)  Ketones, ur TRACE (*)    Leukocytes,Ua TRACE (*)    All other components within normal limits  CBG MONITORING, ED - Abnormal; Notable for the following components:   Glucose-Capillary 167 (*)    All other components within normal limits  CBG MONITORING, ED - Abnormal; Notable for the following components:   Glucose-Capillary 166 (*)    All other components within normal limits  CERVICOVAGINAL ANCILLARY ONLY    EKG   Radiology No results found.  Procedures Procedures (including critical care time)  Medications Ordered in UC Medications - No data to display  Initial Impression / Assessment and Plan / UC Course  I have reviewed the triage vital signs and the nursing notes.  Pertinent labs & imaging results that were available during my care of the patient were reviewed by me and considered in my medical decision making (see chart for details).  Yeast vaginitis 1. Diflucan 150 mg once, 150 mg again in 72 hours as needed 2. Metformin 1000 mg XR  Daily refilled, discussed with patient how controlling diabetes will help with infection  3. PCP referral placed 4. Urinalysis- glucose >1000 5. CBG 166 Final Clinical Impressions(s) / UC Diagnoses   Final diagnoses:  Yeast vaginitis     Discharge Instructions     1. Take one antifungal pill today, can take second pill after 3 days if still having symptoms 2. Restart diabetes medications, take 2 pills every day at breakfast time 3. You will be called to get help with finding a primary care doctor, in the meantime reach out again to schedule appointment with current doctor 4. Follow up if no improvement of symptoms or symptoms worsen 5. Remaining labs will result in 2-3 days, you will be called if positive so that you may receive treatment    ED Prescriptions    Medication Sig Dispense Auth. Provider   fluconazole (DIFLUCAN) 150 MG tablet Take 1 tablet (150 mg total)  by mouth daily. 2 tablet Lowella Petties R, NP   metFORMIN (GLUCOPHAGE-XR) 500 MG 24 hr tablet 2 tabs by mouth with breakfast daily. 60 tablet Kiri Hinderliter, Leitha Schuller, NP     PDMP not reviewed this encounter.   Hans Eden, NP 01/29/21 1407    Hans Eden, NP 01/29/21 1408

## 2021-01-30 ENCOUNTER — Telehealth (HOSPITAL_COMMUNITY): Payer: Self-pay | Admitting: Emergency Medicine

## 2021-01-30 LAB — CERVICOVAGINAL ANCILLARY ONLY
Bacterial Vaginitis (gardnerella): POSITIVE — AB
Candida Glabrata: POSITIVE — AB
Candida Vaginitis: POSITIVE — AB
Chlamydia: NEGATIVE
Comment: NEGATIVE
Comment: NEGATIVE
Comment: NEGATIVE
Comment: NEGATIVE
Comment: NEGATIVE
Comment: NORMAL
Neisseria Gonorrhea: NEGATIVE
Trichomonas: POSITIVE — AB

## 2021-01-30 MED ORDER — METRONIDAZOLE 500 MG PO TABS: 500.0000 mg | ORAL_TABLET | Freq: Two times a day (BID) | ORAL | 0 refills | Status: DC

## 2021-01-31 ENCOUNTER — Telehealth (HOSPITAL_COMMUNITY): Payer: Self-pay | Admitting: Emergency Medicine

## 2021-01-31 MED ORDER — METRONIDAZOLE 500 MG PO TABS: 500.0000 mg | ORAL_TABLET | Freq: Two times a day (BID) | ORAL | 0 refills | Status: DC

## 2021-01-31 NOTE — Telephone Encounter (Signed)
Husband called to ask for a switch in pharmacy.  States the patient spoke to someone last night at the clinic who informed her of results and where to pick up medicines, but pharmacy was closed.  Will resend

## 2021-02-26 ENCOUNTER — Ambulatory Visit (INDEPENDENT_AMBULATORY_CARE_PROVIDER_SITE_OTHER): Payer: Medicaid Other | Admitting: Primary Care

## 2021-02-26 ENCOUNTER — Encounter (INDEPENDENT_AMBULATORY_CARE_PROVIDER_SITE_OTHER): Payer: Self-pay | Admitting: Primary Care

## 2021-02-26 ENCOUNTER — Other Ambulatory Visit: Payer: Self-pay

## 2021-02-26 VITALS — BP 130/90 | HR 64 | Temp 97.3°F | Ht 60.0 in | Wt 181.2 lb

## 2021-02-26 DIAGNOSIS — I10 Essential (primary) hypertension: Secondary | ICD-10-CM

## 2021-02-26 DIAGNOSIS — Z13220 Encounter for screening for lipoid disorders: Secondary | ICD-10-CM

## 2021-02-26 DIAGNOSIS — E1165 Type 2 diabetes mellitus with hyperglycemia: Secondary | ICD-10-CM | POA: Diagnosis not present

## 2021-02-26 DIAGNOSIS — Z131 Encounter for screening for diabetes mellitus: Secondary | ICD-10-CM | POA: Diagnosis not present

## 2021-02-26 LAB — POCT GLYCOSYLATED HEMOGLOBIN (HGB A1C): Hemoglobin A1C: 12.1 % — AB (ref 4.0–5.6)

## 2021-02-26 MED ORDER — JANUMET 50-1000 MG PO TABS: 1.0000 | ORAL_TABLET | Freq: Two times a day (BID) | ORAL | 1 refills | Status: DC

## 2021-02-26 MED ORDER — GLIMEPIRIDE 4 MG PO TABS: 4.0000 mg | ORAL_TABLET | Freq: Every day | ORAL | 3 refills | Status: DC

## 2021-02-26 MED ORDER — BLOOD GLUCOSE METER KIT: PACK | 0 refills | Status: AC

## 2021-02-26 MED ORDER — BASAGLAR KWIKPEN 100 UNIT/ML ~~LOC~~ SOPN: 12.0000 [IU] | PEN_INJECTOR | Freq: Two times a day (BID) | SUBCUTANEOUS | 3 refills | Status: DC

## 2021-02-26 NOTE — Patient Instructions (Signed)
Diabetes Mellitus and Exercise Exercising regularly is important for overall health, especially for people who have diabetes mellitus. Exercising is not only about losing weight. It has many other health benefits, such as increasing muscle strength and bone density and reducing body fat and stress. This leads to improved fitness, flexibility, and endurance, all of which result in better overall health. What are the benefits of exercise if I have diabetes? Exercise has many benefits for people with diabetes. They include:  Helping to lower and control blood sugar (glucose).  Helping the body to respond better to the hormone insulin by improving insulin sensitivity.  Reducing how much insulin the body needs.  Lowering the risk for heart disease by: ? Lowering "bad" cholesterol and triglyceride levels. ? Increasing "good" cholesterol levels. ? Lowering blood pressure. ? Lowering blood glucose levels. What is my activity plan? Your health care provider or certified diabetes educator can help you make a plan for the type and frequency of exercise that works for you. This is called your activity plan. Be sure to:  Get at least 150 minutes of medium-intensity or high-intensity exercise each week. Exercises may include brisk walking, biking, or water aerobics.  Do stretching and strengthening exercises, such as yoga or weight lifting, at least 2 times a week.  Spread out your activity over at least 3 days of the week.  Get some form of physical activity each day. ? Do not go more than 2 days in a row without some kind of physical activity. ? Avoid being inactive for more than 90 minutes at a time. Take frequent breaks to walk or stretch.  Choose exercises or activities that you enjoy. Set realistic goals.  Start slowly and gradually increase your exercise intensity over time.   How do I manage my diabetes during exercise? Monitor your blood glucose  Check your blood glucose before and  after exercising. If your blood glucose is: ? 240 mg/dL (13.3 mmol/L) or higher before you exercise, check your urine for ketones. These are chemicals created by the liver. If you have ketones in your urine, do not exercise until your blood glucose returns to normal. ? 100 mg/dL (5.6 mmol/L) or lower, eat a snack containing 15-20 grams of carbohydrate. Check your blood glucose 15 minutes after the snack to make sure that your glucose level is above 100 mg/dL (5.6 mmol/L) before you start your exercise.  Know the symptoms of low blood glucose (hypoglycemia) and how to treat it. Your risk for hypoglycemia increases during and after exercise. Follow these tips and your health care provider's instructions  Keep a carbohydrate snack that is fast-acting for use before, during, and after exercise to help prevent or treat hypoglycemia.  Avoid injecting insulin into areas of the body that are going to be exercised. For example, avoid injecting insulin into: ? Your arms, when you are about to play tennis. ? Your legs, when you are about to go jogging.  Keep records of your exercise habits. Doing this can help you and your health care provider adjust your diabetes management plan as needed. Write down: ? Food that you eat before and after you exercise. ? Blood glucose levels before and after you exercise. ? The type and amount of exercise you have done.  Work with your health care provider when you start a new exercise or activity. He or she may need to: ? Make sure that the activity is safe for you. ? Adjust your insulin, other medicines, and food that   you eat.  Drink plenty of water while you exercise. This prevents loss of water (dehydration) and problems caused by a lot of heat in the body (heat stroke).   Where to find more information  American Diabetes Association: www.diabetes.org Summary  Exercising regularly is important for overall health, especially for people who have diabetes  mellitus.  Exercising has many health benefits. It increases muscle strength and bone density and reduces body fat and stress. It also lowers and controls blood glucose.  Your health care provider or certified diabetes educator can help you make an activity plan for the type and frequency of exercise that works for you.  Work with your health care provider to make sure any new activity is safe for you. Also work with your health care provider to adjust your insulin, other medicines, and the food you eat. This information is not intended to replace advice given to you by your health care provider. Make sure you discuss any questions you have with your health care provider. Document Revised: 08/15/2019 Document Reviewed: 08/15/2019 Elsevier Patient Education  2021 [employer] Inc.  

## 2021-02-26 NOTE — Progress Notes (Signed)
New Patient Office Visit  Subjective:  Patient ID: Sarah Owens, female    DOB: 1976-04-06  Age: 45 y.o. MRN: [id number]  CC:  Chief Complaint  Patient presents with  . Hospitalization Follow-up    Yeast     HPI Ms. Lennyn Siedlecki is a 45 year old obese female who speaks  Swahili interpreter Jawahir [phone] used. She present for establish ment of care and Urgent care follow up on 01/29/21 for dysuria, itching and vaginal irritation since December 2021.Treated with Diflucan 150 mg once, 150 mg again in 72 hours as needed. Started on oral metformin XR $RemoveBefo'500mg'jYSQZmBuKcN$  daily. DIABETES Hypoglycemic episodes:no Polydipsia/polyuria: yes Visual disturbance: yes  Chest pain: no Paresthesias: no  Past Medical History:  Diagnosis Date  . Diabetes mellitus without complication ([place])   . No pertinent past medical history     Past Surgical History:  Procedure Laterality Date  . CESAREAN SECTION N/A 08/22/2014   Procedure: CESAREAN SECTION;  Surgeon: Allyn Kenner, DO;  Location: [facility];  Service: Obstetrics;  Laterality: N/A;, for breech presentation.  . TUBAL LIGATION Bilateral 08/22/2014   Procedure: BILATERAL TUBAL LIGATION;  Surgeon: Allyn Kenner, DO;  Location: [facility];  Service: Obstetrics;  Laterality: Bilateral;  . WISDOM TOOTH EXTRACTION      Family History  Problem Relation Age of Onset  . Pneumonia Son     Social History   Socioeconomic History  . Marital status: Married    Spouse name: Osie Cheeks  . Number of children: 5  . Years of education: 8  . Highest education level: 8th grade  Occupational History  . Not on file  Tobacco Use  . Smoking status: Never Smoker  . Smokeless tobacco: Never Used  Vaping Use  . Vaping Use: Never used  Substance and Sexual Activity  . Alcohol use: No  . Drug use: No  . Sexual activity: Yes    Birth control/protection: Surgical  Other Topics Concern  . Not on file  Social History Narrative   Lives at home with husband, Osie Cheeks (listed  as Osie Cheeks), who has significant health issues, as well as her 5 children and some older adult step children.         Social Determinants of Health   Financial Resource Strain: Not on file  Food Insecurity: Not on file  Transportation Needs: Not on file  Physical Activity: Not on file  Stress: Not on file  Social Connections: Not on file  Intimate Partner Violence: Not on file    ROS Review of Systems  Eyes: Positive for visual disturbance.  Endocrine: Positive for polydipsia.  All other systems reviewed and are negative.   Objective:   Today's Vitals: BP 130/90 (BP Location: Right Arm, Patient Position: Sitting, Cuff Size: Normal)   Pulse 64   Temp (!) 97.3 F (36.3 C) (Temporal)   Ht 5' (1.524 m)   Wt 181 lb 3.2 oz (82.2 kg)   SpO2 93%   BMI 35.39 kg/m   Physical Exam Vitals reviewed.  Constitutional:      Appearance: Normal appearance. She is obese.  HENT:     Head: Normocephalic.     Right Ear: Tympanic membrane and external ear normal.     Left Ear: Tympanic membrane and external ear normal.     Nose: Nose normal.  Eyes:     Extraocular Movements: Extraocular movements intact.     Pupils: Pupils are equal, round, and reactive to light.  Cardiovascular:  Rate and Rhythm: Normal rate and regular rhythm.  Pulmonary:     Effort: Pulmonary effort is normal.     Breath sounds: Normal breath sounds.  Abdominal:     Palpations: Abdomen is soft.  Musculoskeletal:        General: Normal range of motion.     Cervical back: Normal range of motion and neck supple.  Skin:    General: Skin is warm and dry.  Neurological:     Mental Status: She is alert and oriented to person, place, and time.  Psychiatric:        Mood and Affect: Mood normal.        Behavior: Behavior normal.        Thought Content: Thought content normal.     Assessment & Plan:  Ladonna was seen today for hospitalization follow-up.  Diagnoses and all orders for this visit:  Screening  for diabetes mellitus -     HgB A1c -     CBC with Differential  Uncontrolled type 2 diabetes mellitus with hyperglycemia (HCC) Therapeutic goals for glycemic control related to A1c measurements: Goal of therapy: Less than 6.5 hemoglobin A1c.  Decrease foods that are high in carbohydrates are the following rice, potatoes, breads, sugars, and pastas.  Reduction in the intake (eating) will assist in lowering your blood sugars.Resistance to using insulin Discussed  co- morbidities with uncontrol diabetes  Complications -diabetic retinopathy, (close your eyes ? What do you see nothing) nephropathy decrease in kidney function- can lead to dialysis-on a machine 3 days a week to filter your kidney, neuropathy- numbness and tinging in your hands and feet,  increase risk of heart attack and stroke, and amputation due to decrease wound healing and circulation. Decrease your risk by taking medication daily as prescribed, monitor carbohydrates- foods that are high in carbohydrates are the following rice, potatoes, breads, sugars, and pastas.  Reduction in the intake (eating) will assist in lowering your blood sugars. Exercise daily at least 30 minutes daily. To bring insulin in to teach how to use  -     Insulin Glargine (BASAGLAR KWIKPEN) 100 UNIT/ML; Inject 12 Units into the skin 2 (two) times daily. -     sitaGLIPtin-metformin (JANUMET) 50-1000 MG tablet; Take 1 tablet by mouth 2 (two) times daily with a meal. -     glimepiride (AMARYL) 4 MG tablet; Take 1 tablet (4 mg total) by mouth daily before breakfast.  Essential hypertension Counseled on blood pressure goal of less than 130/80, low-sodium, DASH diet, medication compliance, 150 minutes of moderate intensity exercise per week. Discussed medication compliance, adverse effects.(renal protection) -     CMP14+EGFR  Lipid screening -     Lipid Panel  Other orders -     blood glucose meter kit and supplies; Dispense based on patient and insurance  preference. Use up to four times daily as directed. (FOR ICD-10 E10.9, E11.9).    Outpatient Encounter Medications as of 02/26/2021  Medication Sig  . blood glucose meter kit and supplies Dispense based on patient and insurance preference. Use up to four times daily as directed. (FOR ICD-10 E10.9, E11.9).  Marland Kitchen glimepiride (AMARYL) 4 MG tablet Take 1 tablet (4 mg total) by mouth daily before breakfast.  . Insulin Glargine (BASAGLAR KWIKPEN) 100 UNIT/ML Inject 12 Units into the skin 2 (two) times daily.  . sitaGLIPtin-metformin (JANUMET) 50-1000 MG tablet Take 1 tablet by mouth 2 (two) times daily with a meal.  . Accu-Chek Softclix Lancets lancets Check  blood glucose twice daily before meals. (Patient not taking: Reported on 08/17/2019)  . acetaminophen (TYLENOL) 500 MG tablet Take 500 mg by mouth every 6 (six) hours as needed for mild pain or headache.  . Blood Glucose Monitoring Suppl (ACCU-CHEK AVIVA PLUS) w/Device KIT Check blood glucose twice daily before meals (Patient not taking: No sig reported)  . fluconazole (DIFLUCAN) 150 MG tablet Take 1 tablet (150 mg total) by mouth daily.  Marland Kitchen glucose blood (ACCU-CHEK AVIVA PLUS) test strip Check blood glucose twice daily before meals (Patient not taking: No sig reported)  . [DISCONTINUED] ibuprofen (ADVIL) 800 MG tablet Take 1 tablet (800 mg total) by mouth 3 (three) times daily with meals.  . [DISCONTINUED] metFORMIN (GLUCOPHAGE-XR) 500 MG 24 hr tablet 2 tabs by mouth with breakfast daily.  . [DISCONTINUED] metroNIDAZOLE (FLAGYL) 500 MG tablet Take 1 tablet (500 mg total) by mouth 2 (two) times daily.   No facility-administered encounter medications on file as of 02/26/2021.    Follow-up: Return in about 1 week (around 03/05/2021) for teach how to administor insulin.   Kerin Perna, NP

## 2021-02-27 ENCOUNTER — Other Ambulatory Visit (INDEPENDENT_AMBULATORY_CARE_PROVIDER_SITE_OTHER): Payer: Self-pay | Admitting: Primary Care

## 2021-02-27 DIAGNOSIS — E119 Type 2 diabetes mellitus without complications: Secondary | ICD-10-CM

## 2021-02-27 DIAGNOSIS — E7800 Pure hypercholesterolemia, unspecified: Secondary | ICD-10-CM

## 2021-02-27 LAB — CMP14+EGFR
ALT: 15 IU/L (ref 0–32)
AST: 18 IU/L (ref 0–40)
Albumin/Globulin Ratio: 1.1 — ABNORMAL LOW (ref 1.2–2.2)
Albumin: 4.2 g/dL (ref 3.8–4.8)
Alkaline Phosphatase: 115 IU/L (ref 44–121)
BUN/Creatinine Ratio: 11 (ref 9–23)
BUN: 9 mg/dL (ref 6–24)
Bilirubin Total: 0.2 mg/dL (ref 0.0–1.2)
CO2: 24 mmol/L (ref 20–29)
Calcium: 10.8 mg/dL — ABNORMAL HIGH (ref 8.7–10.2)
Chloride: 99 mmol/L (ref 96–106)
Creatinine, Ser: 0.81 mg/dL (ref 0.57–1.00)
Globulin, Total: 3.9 g/dL (ref 1.5–4.5)
Glucose: 172 mg/dL — ABNORMAL HIGH (ref 65–99)
Potassium: 4.7 mmol/L (ref 3.5–5.2)
Sodium: 135 mmol/L (ref 134–144)
Total Protein: 8.1 g/dL (ref 6.0–8.5)
eGFR: 92 mL/min/{1.73_m2} (ref >59)

## 2021-02-27 LAB — CBC WITH DIFFERENTIAL/PLATELET
Basophils Absolute: 0 10*3/uL (ref 0.0–0.2)
Basos: 1 %
EOS (ABSOLUTE): 0 10*3/uL (ref 0.0–0.4)
Eos: 1 %
Hematocrit: 42.7 % (ref 34.0–46.6)
Hemoglobin: 13.8 g/dL (ref 11.1–15.9)
Immature Grans (Abs): 0 10*3/uL (ref 0.0–0.1)
Immature Granulocytes: 0 %
Lymphocytes Absolute: 1.7 10*3/uL (ref 0.7–3.1)
Lymphs: 38 %
MCH: 27 pg (ref 26.6–33.0)
MCHC: 32.3 g/dL (ref 31.5–35.7)
MCV: 83 fL (ref 79–97)
Monocytes Absolute: 0.4 10*3/uL (ref 0.1–0.9)
Monocytes: 9 %
Neutrophils Absolute: 2.3 10*3/uL (ref 1.4–7.0)
Neutrophils: 51 %
Platelets: 251 10*3/uL (ref 150–450)
RBC: 5.12 x10E6/uL (ref 3.77–5.28)
RDW: 14 % (ref 11.7–15.4)
WBC: 4.4 10*3/uL (ref 3.4–10.8)

## 2021-02-27 LAB — LIPID PANEL
Chol/HDL Ratio: 3.9 ratio (ref 0.0–4.4)
Cholesterol, Total: 178 mg/dL (ref 100–199)
HDL: 46 mg/dL (ref >39)
LDL Chol Calc (NIH): 118 mg/dL — ABNORMAL HIGH (ref 0–99)
Triglycerides: 77 mg/dL (ref 0–149)
VLDL Cholesterol Cal: 14 mg/dL (ref 5–40)

## 2021-02-27 MED ORDER — PRAVASTATIN SODIUM 20 MG PO TABS
20.0000 mg | ORAL_TABLET | Freq: Every day | ORAL | 3 refills | Status: DC
Start: 2021-02-27 — End: 2021-08-14

## 2021-03-13 ENCOUNTER — Encounter (INDEPENDENT_AMBULATORY_CARE_PROVIDER_SITE_OTHER): Payer: Self-pay

## 2021-03-13 ENCOUNTER — Telehealth: Payer: Self-pay

## 2021-03-13 ENCOUNTER — Encounter (INDEPENDENT_AMBULATORY_CARE_PROVIDER_SITE_OTHER): Payer: Self-pay | Admitting: Primary Care

## 2021-03-13 ENCOUNTER — Ambulatory Visit (INDEPENDENT_AMBULATORY_CARE_PROVIDER_SITE_OTHER): Payer: Medicaid Other | Admitting: Primary Care

## 2021-03-13 ENCOUNTER — Other Ambulatory Visit: Payer: Self-pay

## 2021-03-13 VITALS — BP 124/84 | HR 68 | Temp 95.5°F | Ht 60.0 in | Wt 182.2 lb

## 2021-03-13 DIAGNOSIS — E119 Type 2 diabetes mellitus without complications: Secondary | ICD-10-CM

## 2021-03-13 DIAGNOSIS — E669 Obesity, unspecified: Secondary | ICD-10-CM | POA: Diagnosis not present

## 2021-03-13 DIAGNOSIS — Z6837 Body mass index (BMI) 37.0-37.9, adult: Secondary | ICD-10-CM | POA: Diagnosis not present

## 2021-03-13 DIAGNOSIS — Z7189 Other specified counseling: Secondary | ICD-10-CM

## 2021-03-13 NOTE — Telephone Encounter (Signed)
Call received from Gwinda Passe, NP when she was with the patient today. Marcelino Duster had Swahili interpreter, Caryn Bee assisting. Caryn Bee stated that the patient does not read or write English or Swahili    With the assistance of the interpreter provided the patient with the phone number for [facility] to contact for assistance with completing the [place] citizenship paperwork.  The patient will need to have an interpreter assist with making the call to [facility].  If she requests and exemption due to language barrier, the provide can then complete that attestation.  Contacts at [facility]: Janell Quiet # [phone] Josefina Do # [phone].  The patient stated that she understood what she needed to do to complete the [place] paperwork.

## 2021-03-13 NOTE — Progress Notes (Signed)
Subjective:  Patient ID: Sarah Owens, female    DOB: 19-Aug-1976  Age: 45 y.o. MRN: [id number]  CC: diabetic teaching   HPI [name] is a 45 year old Swahili  Female Sarah Owens [phone] Sarah Owens )presents for follow-up of diabetes. Patient does check blood sugar at home  Compliant with meds - Yes Checking CBGs? Yes  Fasting avg - 110-124    Exercising regularly? - Yes Watching carbohydrate intake? - Yes Neuropathy ? - No Hypoglycemic events - No  Pertinent ROS:  Polyuria - Yes Polydipsia - No Vision problems - No  Medications as noted below. Taking them regularly without complication/adverse reaction being reported today.   History Sarah Owens has a past medical history of Diabetes mellitus without complication ([place]) and No pertinent past medical history.   She has a past surgical history that includes Wisdom tooth extraction; Cesarean section (N/A, 08/22/2014); and Tubal ligation (Bilateral, 08/22/2014).   Her family history includes Pneumonia in her son.She reports that she has never smoked. She has never used smokeless tobacco. She reports that she does not drink alcohol and does not use drugs.  Current Outpatient Medications on File Prior to Visit  Medication Sig Dispense Refill  . Accu-Chek Softclix Lancets lancets Check blood glucose twice daily before meals. 100 each 11  . acetaminophen (TYLENOL) 500 MG tablet Take 500 mg by mouth every 6 (six) hours as needed for mild pain or headache.    . blood glucose meter kit and supplies Dispense based on patient and insurance preference. Use up to four times daily as directed. (FOR ICD-10 E10.9, E11.9). 1 each 0  . Blood Glucose Monitoring Suppl (ACCU-CHEK AVIVA PLUS) w/Device KIT Check blood glucose twice daily before meals 1 kit 0  . fluconazole (DIFLUCAN) 150 MG tablet Take 1 tablet (150 mg total) by mouth daily. 2 tablet 0  . glimepiride (AMARYL) 4 MG tablet Take 1 tablet (4 mg total) by mouth daily before breakfast. 30 tablet 3  .  glucose blood (ACCU-CHEK AVIVA PLUS) test strip Check blood glucose twice daily before meals 100 each 12  . pravastatin (PRAVACHOL) 20 MG tablet Take 1 tablet (20 mg total) by mouth daily. 90 tablet 3  . sitaGLIPtin-metformin (JANUMET) 50-1000 MG tablet Take 1 tablet by mouth 2 (two) times daily with a meal. 180 tablet 1  . Insulin Glargine (BASAGLAR KWIKPEN) 100 UNIT/ML Inject 12 Units into the skin 2 (two) times daily. (Patient not taking: Reported on 03/13/2021) 3 mL 3   No current facility-administered medications on file prior to visit.    ROS Review of Systems  Endocrine: Positive for polyuria.  All other systems reviewed and are negative.   Objective:  BP 124/84 (BP Location: Right Arm, Patient Position: Sitting, Cuff Size: Large)   Pulse 68   Temp (!) 95.5 F (35.3 C) (Temporal)   Ht 5' (1.524 m)   Wt 182 lb 3.2 oz (82.6 kg)   SpO2 100%   BMI 35.58 kg/m   BP Readings from Last 3 Encounters:  03/13/21 124/84  02/26/21 130/90  01/29/21 133/81    Wt Readings from Last 3 Encounters:  03/13/21 182 lb 3.2 oz (82.6 kg)  02/26/21 181 lb 3.2 oz (82.2 kg)  07/09/20 242 lb 8.1 oz (110 kg)    Physical Exam Vitals reviewed.  Constitutional:      Appearance: She is obese.  HENT:     Head: Normocephalic.     Right Ear: External ear normal.     Left  Ear: External ear normal.     Nose: Nose normal.  Cardiovascular:     Rate and Rhythm: Normal rate and regular rhythm.  Pulmonary:     Effort: Pulmonary effort is normal.     Breath sounds: Normal breath sounds.  Abdominal:     General: Bowel sounds are normal.     Palpations: Abdomen is soft.  Musculoskeletal:        General: Normal range of motion.     Cervical back: Normal range of motion and neck supple.  Skin:    General: Skin is warm and dry.  Neurological:     Mental Status: She is oriented to person, place, and time.  Psychiatric:        Mood and Affect: Mood normal.        Behavior: Behavior normal.         Thought Content: Thought content normal.        Judgment: Judgment normal.     Lab Results  Component Value Date   HGBA1C 12.1 (A) 02/26/2021   HGBA1C 8.9 (H) 04/05/2019    Lab Results  Component Value Date   WBC 4.4 02/26/2021   HGB 13.8 02/26/2021   HCT 42.7 02/26/2021   PLT 251 02/26/2021   GLUCOSE 172 (H) 02/26/2021   CHOL 178 02/26/2021   TRIG 77 02/26/2021   HDL 46 02/26/2021   LDLCALC 118 (H) 02/26/2021   ALT 15 02/26/2021   AST 18 02/26/2021   NA 135 02/26/2021   K 4.7 02/26/2021   CL 99 02/26/2021   CREATININE 0.81 02/26/2021   BUN 9 02/26/2021   CO2 24 02/26/2021   TSH 2.454 04/26/2020   HGBA1C 12.1 (A) 02/26/2021     Assessment & Plan:   Follow-up:  Sarah Owens was seen today for diabetic teaching.  Diagnoses and all orders for this visit:  Comprehensive diabetic foot examination, type 2 DM, encounter for [facility]) completed  Type 2 diabetes mellitus without complication, without long-term current use of insulin ([place]) Refuses insulin. -     Microalbumin, urine -     Ambulatory referral to Ophthalmology  Class 2 obesity with body mass index (BMI) of 37.0 to 37.9 in adult, unspecified obesity type, unspecified whether serious comorbidity present Obesity is 30-39 indicating an excess in caloric intake or underlining conditions. This may lead to other co-morbidities. (HTN, respiratory complication, arthralgia.  Lifestyle modifications of diet and exercise may reduce obesity. This will help with DM and HTN  Encounter for diabetes education Patient not understanding why she needs insulin when her CBC readings at home < 200. Her husband is a diabetic numbers are higher and not on insulin.   Your A1C is a measure of your sugar over the past 3 months and is not affected by what you have eaten over the past few days. Diabetes increases your chances of stroke and heart attack over 300 % and is the leading cause of blindness and kidney failure in the [place]. Please  make sure you decrease bad carbs like white bread, white rice, potatoes, corn, soft drinks, pasta, cereals, refined sugars, sweet tea, dried fruits, and fruit juice. Good carbs are okay to eat in moderation like sweet potatoes, brown rice, whole grain pasta/bread, most fruit (except dried fruit) and you can eat as many veggies as you want.   Greater than 6.5 is considered diabetic. Between 6.4 and 5.7 is prediabetic If your A1C is less than 5.7 you are NOT diabetic.  Targets for  Glucose Readings: Time of Check Target for patients WITHOUT Diabetes Target for DIABETICS  Before Meals Less than 100  less than 150  Two hours after meals Less than 200  Less than 250   Patient requesting assistance with citizen ship form filled out to allow her not to take the test. Call for assistance Opal Sidles RN resource to speak with patient and interputor together spent > 1hr with this and diabetes.   Return in about 2 months (around 05/13/2021) for DM/Fasting. > 70 percent diabetic teaching   The above assessment and management plan was discussed with the patient. The patient verbalized understanding of and has agreed to the management plan. Patient is aware to call the clinic if symptoms fail to improve or worsen. Patient is aware when to return to the clinic for a follow-up visit. Patient educated on when it is appropriate to go to the emergency department.   Juluis Mire, NP-C

## 2021-05-13 ENCOUNTER — Ambulatory Visit (INDEPENDENT_AMBULATORY_CARE_PROVIDER_SITE_OTHER): Payer: Medicaid Other | Admitting: Primary Care

## 2021-05-13 ENCOUNTER — Other Ambulatory Visit: Payer: Self-pay

## 2021-05-13 ENCOUNTER — Encounter (INDEPENDENT_AMBULATORY_CARE_PROVIDER_SITE_OTHER): Payer: Self-pay | Admitting: Primary Care

## 2021-05-13 VITALS — BP 154/85 | HR 57 | Temp 97.5°F | Ht 60.0 in | Wt 186.8 lb

## 2021-05-13 DIAGNOSIS — N912 Amenorrhea, unspecified: Secondary | ICD-10-CM | POA: Diagnosis not present

## 2021-05-13 DIAGNOSIS — E1165 Type 2 diabetes mellitus with hyperglycemia: Secondary | ICD-10-CM

## 2021-05-13 DIAGNOSIS — I10 Essential (primary) hypertension: Secondary | ICD-10-CM

## 2021-05-13 DIAGNOSIS — Z3202 Encounter for pregnancy test, result negative: Secondary | ICD-10-CM | POA: Diagnosis not present

## 2021-05-13 LAB — POCT GLYCOSYLATED HEMOGLOBIN (HGB A1C): Hemoglobin A1C: 9 % — AB (ref 4.0–5.6)

## 2021-05-13 LAB — POCT URINE PREGNANCY: Preg Test, Ur: NEGATIVE

## 2021-05-13 MED ORDER — LISINOPRIL 20 MG PO TABS: 20.0000 mg | ORAL_TABLET | Freq: Every day | ORAL | 3 refills | Status: DC

## 2021-05-13 MED ORDER — JANUMET 50-1000 MG PO TABS
1.0000 | ORAL_TABLET | Freq: Two times a day (BID) | ORAL | 1 refills | Status: DC
Start: 2021-05-13 — End: 2021-08-13

## 2021-05-13 MED ORDER — GLIMEPIRIDE 4 MG PO TABS: 4.0000 mg | ORAL_TABLET | Freq: Every day | ORAL | 3 refills | Status: DC

## 2021-05-13 NOTE — Progress Notes (Signed)
[facility]    Interpreter: Swahili; Arnell Asal [phone]  [name] is a 45 y.o.  obese female who presents for an follow up evaluation of Type 2 diabetes mellitus.  Current symptoms/problems include none .   Current diabetic medications include oral  sitaGLIPtin-metformin (JANUMET) 50-1000 MG tablet and glimepiride (AMARYL) 4 MG tablet Refuses to be on insulin last visit   The patient was initially diagnosed with Type 2 diabetes mellitus based on the following criteria:  ADA guidelines. 9.0  Current monitoring regimen: none Home blood sugar records:  non Any episodes of hypoglycemia? no  Known diabetic complications: none Cardiovascular risk factors: diabetes mellitus, hypertension, and obesity (BMI >= 30 kg/m2) Eye exam current (within one year): no Weight trend: stable Prior visit with CDE: yes -  Current diet: in general, a "healthy" diet   Current exercise: none Medication Compliance?  Yes   Is She on ACE inhibitor or angiotensin II receptor blocker?  Yes  lisinopril (Zestril)   The following portions of the patient's history were reviewed and updated as appropriate: allergies, current medications, past family history, past medical history, past social history, past surgical history, and problem list.  Review of Systems  All other systems reviewed and are negative.  Objective:    BP (!) 154/85 (BP Location: Right Arm, Patient Position: Sitting, Cuff Size: Large)   Pulse (!) 57   Temp (!) 97.5 F (36.4 C) (Temporal)   Ht 5' (1.524 m)   Wt 186 lb 12.8 oz (84.7 kg)   LMP 03/12/2021 (Approximate)   SpO2 98%   BMI 36.48 kg/m   Physical Exam Vitals reviewed.  Constitutional:      Appearance: She is obese.  HENT:     Head: Normocephalic.     Right Ear: Tympanic membrane and external ear normal.     Left Ear: Tympanic membrane and external ear normal.     Nose: Nose normal.  Eyes:     Extraocular Movements: Extraocular movements intact.      Pupils: Pupils are equal, round, and reactive to light.  Cardiovascular:     Rate and Rhythm: Normal rate and regular rhythm.  Pulmonary:     Effort: Pulmonary effort is normal.     Breath sounds: Normal breath sounds.  Abdominal:     General: Abdomen is flat. Bowel sounds are normal.     Palpations: Abdomen is soft.  Musculoskeletal:        General: Normal range of motion.     Cervical back: Normal range of motion and neck supple.  Skin:    General: Skin is warm and dry.  Neurological:     Mental Status: She is alert and oriented to person, place, and time.  Psychiatric:        Mood and Affect: Mood normal.        Behavior: Behavior normal.        Thought Content: Thought content normal.        Judgment: Judgment normal.     Lab Review Glucose (mg/dL)  Date Value  32/67/1245 172 (H)  04/05/2019 172 (H)   Glucose, Bld (mg/dL)  Date Value  80/99/8338 235 (H)  04/16/2017 98  09/05/2014 70   CO2 (mmol/L)  Date Value  02/26/2021 24  07/09/2020 25  04/05/2019 21   BUN (mg/dL)  Date Value  25/04/3975 9  07/09/2020 9  04/05/2019 11  04/16/2017 11  09/05/2014 14   Creatinine, Ser (mg/dL)  Date Value  73/41/9379  0.81  07/09/2020 0.90  04/05/2019 0.74      Assessment:    Diabetes Mellitus type II, under poor control.   Diagnoses and all orders for this visit:  Uncontrolled type 2 diabetes mellitus with hyperglycemia (HCC) -     HgB A1c 9.2  Refuses insulin only wants oral medication.  Discussed  co- morbidities with uncontrol diabetes  Complications -diabetic retinopathy, (close your eyes ? What do you see nothing) nephropathy decrease in kidney function- can lead to dialysis-on a machine 3 days a week to filter your kidney, neuropathy- numbness and tinging in your hands and feet,  increase risk of heart attack and stroke, and amputation due to decrease wound healing and circulation. Decrease your risk by taking medication daily as prescribed, monitor  carbohydrates- foods that are high in carbohydrates are the following rice, potatoes, breads, sugars, and pastas.  Reduction in the intake (eating) will assist in lowering your blood sugars. Exercise daily at least 30 minutes daily.   Amenorrhea -     POCT urine pregnancy Tubes are tied.   Essential hypertension Denies shortness of breath, headaches, chest pain or lower extremity edema admits to having increase her salt in diet - her children have been cooking . Counseled on blood pressure goal of less than 130/80, low-sodium, DASH diet, medication compliance, 150 minutes of moderate intensity exercise per week. Discussed medication compliance, adverse effects.    Plan:    1.  Rx changes: none  2.  Education: Reviewed 'ABCs' of diabetes management (respective goals in parentheses):  A1C (<7), blood pressure (<130/80), and cholesterol (LDL <100). Counseled Patient on the risk factors of co- morbidities with uncontrol diabetes  Complications -diabetic retinopathy, (close your eyes ? What do you see nothing) nephropathy decrease in kidney function- can lead to dialysis-on a machine 3 days a week to filter your kidney, neuropathy- numbness and tinging in your hands and feet,  increase risk of heart attack and stroke, and amputation due to decrease wound healing and circulation. Decrease your risk by taking medication daily as prescribed, monitor carbohydrates- foods that are high in carbohydrates are the following rice, potatoes, breads, sugars, and pastas.  Reduction in the intake (eating) will assist in lowering your blood sugars. Exercise daily at least 30 minutes daily.   3. CHO counting diet discussed.  Reviewed CHO amount in various foods and how to read nutrition labels.  Discussed recommended serving sizes.    4.  Recommended increase physical activity - goal is 150 minutes per week    This note has been created with Education officer, environmental. Any  transcriptional errors are unintentional.   Grayce Sessions, NP 05/13/2021, 9:21 AM

## 2021-05-14 LAB — MICROALBUMIN, URINE: Microalbumin, Urine: 9.5 ug/mL

## 2021-08-13 ENCOUNTER — Encounter (INDEPENDENT_AMBULATORY_CARE_PROVIDER_SITE_OTHER): Payer: Self-pay | Admitting: Primary Care

## 2021-08-13 ENCOUNTER — Ambulatory Visit (INDEPENDENT_AMBULATORY_CARE_PROVIDER_SITE_OTHER): Payer: Medicaid Other | Admitting: Primary Care

## 2021-08-13 ENCOUNTER — Other Ambulatory Visit: Payer: Self-pay

## 2021-08-13 VITALS — BP 132/83 | HR 64 | Temp 97.5°F | Ht 60.0 in | Wt 192.8 lb

## 2021-08-13 DIAGNOSIS — I10 Essential (primary) hypertension: Secondary | ICD-10-CM | POA: Diagnosis not present

## 2021-08-13 DIAGNOSIS — E1165 Type 2 diabetes mellitus with hyperglycemia: Secondary | ICD-10-CM

## 2021-08-13 DIAGNOSIS — E7800 Pure hypercholesterolemia, unspecified: Secondary | ICD-10-CM

## 2021-08-13 DIAGNOSIS — E669 Obesity, unspecified: Secondary | ICD-10-CM

## 2021-08-13 DIAGNOSIS — Z6837 Body mass index (BMI) 37.0-37.9, adult: Secondary | ICD-10-CM | POA: Diagnosis not present

## 2021-08-13 DIAGNOSIS — Z760 Encounter for issue of repeat prescription: Secondary | ICD-10-CM

## 2021-08-13 LAB — GLUCOSE, POCT (MANUAL RESULT ENTRY): POC Glucose: 173 mg/dl — AB (ref 70–99)

## 2021-08-13 LAB — POCT GLYCOSYLATED HEMOGLOBIN (HGB A1C): Hemoglobin A1C: 8.8 % — AB (ref 4.0–5.6)

## 2021-08-13 MED ORDER — LISINOPRIL 20 MG PO TABS: 20.0000 mg | ORAL_TABLET | Freq: Every day | ORAL | 3 refills | Status: DC

## 2021-08-13 MED ORDER — JANUMET 50-1000 MG PO TABS: 1.0000 | ORAL_TABLET | Freq: Two times a day (BID) | ORAL | 1 refills | Status: DC

## 2021-08-13 MED ORDER — GLIMEPIRIDE 4 MG PO TABS: 4.0000 mg | ORAL_TABLET | Freq: Every day | ORAL | 3 refills | Status: DC

## 2021-08-13 NOTE — Progress Notes (Signed)
[facility]   [name] is a 45 y.o. [place] obese female who speaks ( Swahili interpretor Hannah) presents for hypertension evaluation, Denies shortness of breath, headaches, chest pain or lower extremity edema, sudden onset, vision changes, unilateral weakness, dizziness, paresthesias . She was told by Immigration lawyers to take form to her PCP to fill out for disability). Unable to fill out form for immigration form CM provided name and number to reach [facility] [place].  Patient reports adherence with medications.  Dietary habits include: very well no diet followed  Exercise habits include:yes walking  Family / Social history: No She  also has hypertension management. Denies shortness of breath, headaches, chest pain or lower extremity edema.  Past Medical History:  Diagnosis Date   Diabetes mellitus without complication ([place])    No pertinent past medical history    Past Surgical History:  Procedure Laterality Date   CESAREAN SECTION N/A 08/22/2014   Procedure: CESAREAN SECTION;  Surgeon: Allyn Kenner, DO;  Location: [facility];  Service: Obstetrics;  Laterality: N/A;, for breech presentation.   TUBAL LIGATION Bilateral 08/22/2014   Procedure: BILATERAL TUBAL LIGATION;  Surgeon: Allyn Kenner, DO;  Location: [facility];  Service: Obstetrics;  Laterality: Bilateral;   WISDOM TOOTH EXTRACTION     No Known Allergies Current Outpatient Medications on File Prior to Visit  Medication Sig Dispense Refill   Accu-Chek Softclix Lancets lancets Check blood glucose twice daily before meals. 100 each 11   acetaminophen (TYLENOL) 500 MG tablet Take 500 mg by mouth every 6 (six) hours as needed for mild pain or headache.     blood glucose meter kit and supplies Dispense based on patient and insurance preference. Use up to four times daily as directed. (FOR ICD-10 E10.9, E11.9). 1 each 0   Blood Glucose Monitoring Suppl (ACCU-CHEK AVIVA PLUS) w/Device KIT  Check blood glucose twice daily before meals 1 kit 0   glimepiride (AMARYL) 4 MG tablet Take 1 tablet (4 mg total) by mouth daily before breakfast. 90 tablet 3   glucose blood (ACCU-CHEK AVIVA PLUS) test strip Check blood glucose twice daily before meals 100 each 12   lisinopril (ZESTRIL) 20 MG tablet Take 1 tablet (20 mg total) by mouth daily. 90 tablet 3   pravastatin (PRAVACHOL) 20 MG tablet Take 1 tablet (20 mg total) by mouth daily. 90 tablet 3   sitaGLIPtin-metformin (JANUMET) 50-1000 MG tablet Take 1 tablet by mouth 2 (two) times daily with a meal. 180 tablet 1   No current facility-administered medications on file prior to visit.   Social History   Socioeconomic History   Marital status: Married    Spouse name: Osie Cheeks   Number of children: 5   Years of education: 8   Highest education level: 8th grade  Occupational History   Not on file  Tobacco Use   Smoking status: Never   Smokeless tobacco: Never  Vaping Use   Vaping Use: Never used  Substance and Sexual Activity   Alcohol use: No   Drug use: No   Sexual activity: Yes    Birth control/protection: Surgical  Other Topics Concern   Not on file  Social History Narrative   Lives at home with husband, Osie Cheeks (listed as Osie Cheeks), who has significant health issues, as well as her 5 children and some older adult step children.         Social Determinants of Health   Financial Resource Strain: Not on  file  Food Insecurity: Not on file  Transportation Needs: Not on file  Physical Activity: Not on file  Stress: Not on file  Social Connections: Not on file  Intimate Partner Violence: Not on file   Family History  Problem Relation Age of Onset   Pneumonia Son    OBJECTIVE: Vitals:   08/13/21 0921  BP: 132/83  Pulse: 64  Temp: (!) 97.5 F (36.4 C)  TempSrc: Temporal  SpO2: 100%  Weight: 192 lb 12.8 oz (87.5 kg)  Height: 5' (1.524 m)   Physical Exam Physical Exam General: Vital signs  reviewed. [place] obese female is well-developed and well-nourished, in no acute distress and cooperative with exam.  Head: Normocephalic and atraumatic. Eyes: EOMI, conjunctivae normal, no scleral icterus.  Neck: Supple, trachea midline, normal ROM, no JVD, masses, thyromegaly, or carotid bruit present.  Cardiovascular: RRR, S1 normal, S2 normal, no murmurs, gallops, or rubs. Pulmonary/Chest: Clear to auscultation bilaterally, no wheezes, rales, or rhonchi. Abdominal: Soft, non-tender, non-distended, BS +, no masses, organomegaly, or guarding present.  Musculoskeletal: No joint deformities, erythema, or stiffness, ROM full and nontender. Extremities: No lower extremity edema bilaterally,  pulses symmetric and intact bilaterally. No cyanosis or clubbing. Neurological: A&O x3, Strength is normal and symmetric bilaterally, cranial nerve II-XII are grossly intact, no focal motor deficit, sensory intact to light touch bilaterally.  Skin: Warm, dry and intact. No rashes or erythema. Psychiatric: Normal mood and affect. speech and behavior is normal. Cognition and memory are normal.    Review of Systems  All other systems reviewed and are negative.  Last 3 Office BP readings: BP Readings from Last 3 Encounters:  05/13/21 (!) 154/85  03/13/21 124/84  02/26/21 130/90    BMET    Component Value Date/Time   NA 135 02/26/2021 1613   K 4.7 02/26/2021 1613   CL 99 02/26/2021 1613   CO2 24 02/26/2021 1613   GLUCOSE 172 (H) 02/26/2021 1613   GLUCOSE 235 (H) 07/09/2020 2157   BUN 9 02/26/2021 1613   CREATININE 0.81 02/26/2021 1613   CALCIUM 10.8 (H) 02/26/2021 1613   GFRNONAA >60 07/09/2020 2157   GFRAA >60 07/09/2020 2157    Renal function: CrCl cannot be calculated (Patient's most recent lab result is older than the maximum 21 days allowed.).  Clinical ASCVD: Yes  The 10-year ASCVD risk score (Arnett DK, et al., 2019) is: 15.1%   Values used to calculate the score:     Age: 3  years     Sex: Female     Is Non-Hispanic African American: Yes     Diabetic: Yes     Tobacco smoker: No     Systolic Blood Pressure: 564 mmHg     Is BP treated: Yes     HDL Cholesterol: 46 mg/dL     Total Cholesterol: 178 mg/dL  ASCVD risk factors include- [place]  ASSESSMENT & PLAN: Tam was seen today for diabetes and hypertension.  Essential hypertension Diagnoses and all orders for this visit: -Counseled on lifestyle modifications for blood pressure control including reduced dietary sodium, increased exercise, weight reduction and adequate sleep. Also, educated patient about the risk for cardiovascular events, stroke and heart attack. Also counseled patient about the importance of medication adherence. If you participate in smoking, it is important to stop using tobacco as this will increase the risks associated with uncontrolled blood pressure.   -Hypertension longstanding diagnosed currently Lisinopril 20 mg on current medications. Patient is not adherent with current medications.  Goal BP:  For patients younger than 60: Goal BP < 130/80. For patients 60 and older: Goal BP < 140/90. For patients with diabetes: Goal BP < 130/80. Your most recent BP: 132/83  Minimize salt intake. Minimize alcohol intake  Elevated LDL cholesterol level  Healthy lifestyle diet of fruits vegetables fish nuts whole grains and low saturated fat . Foods high in cholesterol or liver, fatty meats,cheese, butter avocados, nuts and seeds, chocolate and fried foods.  Uncontrolled type 2 diabetes mellitus with hyperglycemia (HCC)  Goal of therapy: Less than 6.5 hemoglobin A1c.  She is not monitoring  foods that are high in carbohydrates are the following rice, potatoes, breads, sugars, and pastas.  Reduction in the intake (eating) will assist in lowering your blood sugars.  Decrease breads and fufu.   Class 2 obesity with body mass index (BMI) of 37.0 to 37.9 in adult, unspecified obesity type,  unspecified whether serious comorbidity present Obesity is 30-39 indicating an excess in caloric intake or underlining conditions. This may lead to other co-morbidities. Lifestyle modifications of diet and exercise may reduce obesity.       .This note has been created with Surveyor, quantity. Any transcriptional errors are unintentional.   Kerin Perna, NP 08/13/2021, 9:08 AM

## 2021-08-14 ENCOUNTER — Other Ambulatory Visit (INDEPENDENT_AMBULATORY_CARE_PROVIDER_SITE_OTHER): Payer: Self-pay | Admitting: Primary Care

## 2021-08-14 DIAGNOSIS — E7800 Pure hypercholesterolemia, unspecified: Secondary | ICD-10-CM

## 2021-08-14 LAB — LIPID PANEL
Chol/HDL Ratio: 3.6 ratio (ref 0.0–4.4)
Cholesterol, Total: 181 mg/dL (ref 100–199)
HDL: 50 mg/dL (ref >39)
LDL Chol Calc (NIH): 121 mg/dL — ABNORMAL HIGH (ref 0–99)
Triglycerides: 49 mg/dL (ref 0–149)
VLDL Cholesterol Cal: 10 mg/dL (ref 5–40)

## 2021-08-14 MED ORDER — ATORVASTATIN CALCIUM 10 MG PO TABS: 10.0000 mg | ORAL_TABLET | Freq: Every day | ORAL | 3 refills | Status: DC

## 2021-08-15 ENCOUNTER — Encounter: Payer: Medicaid Other | Attending: Primary Care | Admitting: Dietician

## 2021-08-28 ENCOUNTER — Ambulatory Visit (INDEPENDENT_AMBULATORY_CARE_PROVIDER_SITE_OTHER): Payer: Medicaid Other | Admitting: Primary Care

## 2021-09-09 ENCOUNTER — Encounter (INDEPENDENT_AMBULATORY_CARE_PROVIDER_SITE_OTHER): Payer: Self-pay

## 2021-10-28 ENCOUNTER — Encounter (HOSPITAL_COMMUNITY): Payer: Self-pay | Admitting: Emergency Medicine

## 2021-10-28 ENCOUNTER — Other Ambulatory Visit: Payer: Self-pay

## 2021-10-28 ENCOUNTER — Ambulatory Visit (HOSPITAL_COMMUNITY)
Admission: EM | Admit: 2021-10-28 | Discharge: 2021-10-28 | Disposition: A | Discharge status: Home / Self Care | Payer: Medicaid Other | Attending: Emergency Medicine | Admitting: Emergency Medicine

## 2021-10-28 DIAGNOSIS — K029 Dental caries, unspecified: Secondary | ICD-10-CM

## 2021-10-28 DIAGNOSIS — K0889 Other specified disorders of teeth and supporting structures: Secondary | ICD-10-CM | POA: Diagnosis not present

## 2021-10-28 DIAGNOSIS — K047 Periapical abscess without sinus: Secondary | ICD-10-CM

## 2021-10-28 MED ORDER — AMOXICILLIN-POT CLAVULANATE 875-125 MG PO TABS: 1.0000 | ORAL_TABLET | Freq: Two times a day (BID) | ORAL | 0 refills | Status: DC

## 2021-10-28 NOTE — ED Provider Notes (Signed)
[facility]    CSN: [id number] Arrival date & time: 10/28/21  1040      History   Chief Complaint Chief Complaint  Patient presents with   Dental Pain    HPI Sarah Owens is a 45 y.o. female.   Interp line used. Pt has rt side upper tooth pain. Pt has had 10 teeth in the past pulled for the samething. Pt has pain not getting any  better. Has not taken anything pta.    Past Medical History:  Diagnosis Date   Diabetes mellitus without complication ([place])    High cholesterol    No pertinent past medical history     Patient Active Problem List   Diagnosis Date Noted   Problem with medical care compliance 06/06/2019   Abdominal pain 06/06/2019   Type 2 diabetes mellitus without complication, without long-term current use of insulin ([place]) 06/06/2019   Class 2 obesity with body mass index (BMI) of 37.0 to 37.9 in adult 04/05/2019   Menorrhagia with regular cycle 04/05/2019   Cesarean delivery delivered 08/22/2014    Past Surgical History:  Procedure Laterality Date   CESAREAN SECTION N/A 08/22/2014   Procedure: CESAREAN SECTION;  Surgeon: Allyn Kenner, DO;  Location: [facility];  Service: Obstetrics;  Laterality: N/A;, for breech presentation.   TUBAL LIGATION Bilateral 08/22/2014   Procedure: BILATERAL TUBAL LIGATION;  Surgeon: Allyn Kenner, DO;  Location: [facility];  Service: Obstetrics;  Laterality: Bilateral;   WISDOM TOOTH EXTRACTION      OB History     Gravida  8   Para  8   Term  8   Preterm  0   AB  0   Living  5      SAB  0   IAB  0   Ectopic  0   Multiple  0   Live Births  6            Home Medications    Prior to Admission medications   Medication Sig Start Date End Date Taking? Authorizing Provider  amoxicillin-clavulanate (AUGMENTIN) 875-125 MG tablet Take 1 tablet by mouth every 12 (twelve) hours. 10/28/21  Yes Marney Setting, NP  Accu-Chek Softclix Lancets lancets Check blood glucose twice daily before meals.  06/06/19   Mack Hook, MD  acetaminophen (TYLENOL) 500 MG tablet Take 500 mg by mouth every 6 (six) hours as needed for mild pain or headache.    [provider]  atorvastatin (LIPITOR) 10 MG tablet Take 1 tablet (10 mg total) by mouth daily. 08/14/21   Kerin Perna, NP  blood glucose meter kit and supplies Dispense based on patient and insurance preference. Use up to four times daily as directed. (FOR ICD-10 E10.9, E11.9). 02/26/21   Kerin Perna, NP  Blood Glucose Monitoring Suppl (ACCU-CHEK AVIVA PLUS) w/Device KIT Check blood glucose twice daily before meals 04/11/19   Mack Hook, MD  glimepiride (AMARYL) 4 MG tablet Take 1 tablet (4 mg total) by mouth daily before breakfast. 08/13/21   Kerin Perna, NP  glucose blood (ACCU-CHEK AVIVA PLUS) test strip Check blood glucose twice daily before meals 04/11/19   Mack Hook, MD  lisinopril (ZESTRIL) 20 MG tablet Take 1 tablet (20 mg total) by mouth daily. 08/13/21   Kerin Perna, NP  sitaGLIPtin-metformin (JANUMET) 50-1000 MG tablet Take 1 tablet by mouth 2 (two) times daily with a meal. 08/13/21   Kerin Perna, NP    Family History Family History  Problem Relation Age of Onset   Pneumonia Son     Social History Social History   Tobacco Use   Smoking status: Never   Smokeless tobacco: Never  Vaping Use   Vaping Use: Never used  Substance Use Topics   Alcohol use: No   Drug use: No     Allergies   Patient has no known allergies.   Review of Systems Review of Systems  Constitutional:  Negative for fever.  HENT:  Positive for dental problem and facial swelling.   Respiratory: Negative.    Cardiovascular: Negative.   Gastrointestinal: Negative.   Genitourinary: Negative.     Physical Exam Triage Vital Signs ED Triage Vitals  Enc Vitals Group     BP 10/28/21 1312 (!) 146/74     Pulse Rate 10/28/21 1312 61     Resp 10/28/21 1312 16     Temp 10/28/21 1312 98.9 F  (37.2 C)     Temp Source 10/28/21 1312 Oral     SpO2 10/28/21 1312 100 %     Weight --      Height --      Head Circumference --      Peak Flow --      Pain Score 10/28/21 1309 9     Pain Loc --      Pain Edu? --      Excl. in [place]? --    No data found.  Updated Vital Signs BP (!) 146/74 (BP Location: Right Arm)   Pulse 61   Temp 98.9 F (37.2 C) (Oral)   Resp 16   SpO2 100%   Visual Acuity Right Eye Distance:   Left Eye Distance:   Bilateral Distance:    Right Eye Near:   Left Eye Near:    Bilateral Near:     Physical Exam Constitutional:      General: She is in acute distress.     Appearance: Normal appearance.  HENT:     Right Ear: Tympanic membrane normal.     Left Ear: Tympanic membrane normal.     Nose: Nose normal.     Mouth/Throat:     Dentition: Dental caries present.     Tonsils: 0 on the right. 0 on the left.      Comments: Several missing teeth and dental caries.  Cardiovascular:     Rate and Rhythm: Normal rate.  Pulmonary:     Effort: Pulmonary effort is normal.  Abdominal:     General: Abdomen is flat.  Neurological:     Mental Status: She is alert.     UC Treatments / Results  Labs (all labs ordered are listed, but only abnormal results are displayed) Labs Reviewed - No data to display  EKG   Radiology No results found.  Procedures Procedures (including critical care time)  Medications Ordered in UC Medications - No data to display  Initial Impression / Assessment and Plan / UC Course  I have reviewed the triage vital signs and the nursing notes.  Pertinent labs & imaging results that were available during my care of the patient were reviewed by me and considered in my medical decision making (see chart for details).    You will need to see a dentist soon  Will start on abx and take with food  Take motrin as needed for pain  Can use warm salt water gargles   Final Clinical Impressions(s) / UC Diagnoses   Final  diagnoses:  Pain, dental  Dental caries  Dental infection     Discharge Instructions      You will need to see a dentist soon  Will start on abx and take with food  Take motrin as needed for pain      ED Prescriptions     Medication Sig Dispense Auth. Provider   amoxicillin-clavulanate (AUGMENTIN) 875-125 MG tablet Take 1 tablet by mouth every 12 (twelve) hours. 14 tablet Marney Setting, NP      PDMP not reviewed this encounter.   Marney Setting, NP 10/28/21 1332

## 2021-10-28 NOTE — ED Triage Notes (Signed)
Dental pain on right side for 3 days and not able to eat.

## 2021-10-28 NOTE — Discharge Instructions (Addendum)
You will need to see a dentist soon  Will start on abx and take with food  Take motrin as needed for pain

## 2021-11-12 ENCOUNTER — Ambulatory Visit (INDEPENDENT_AMBULATORY_CARE_PROVIDER_SITE_OTHER): Payer: Medicaid Other | Admitting: Primary Care

## 2021-11-12 ENCOUNTER — Encounter (INDEPENDENT_AMBULATORY_CARE_PROVIDER_SITE_OTHER): Payer: Self-pay | Admitting: Primary Care

## 2021-11-12 ENCOUNTER — Other Ambulatory Visit: Payer: Self-pay

## 2021-11-12 VITALS — BP 126/84 | HR 65 | Temp 97.9°F | Ht 63.0 in | Wt 192.4 lb

## 2021-11-12 DIAGNOSIS — Z760 Encounter for issue of repeat prescription: Secondary | ICD-10-CM

## 2021-11-12 DIAGNOSIS — E119 Type 2 diabetes mellitus without complications: Secondary | ICD-10-CM

## 2021-11-12 DIAGNOSIS — E1165 Type 2 diabetes mellitus with hyperglycemia: Secondary | ICD-10-CM | POA: Diagnosis not present

## 2021-11-12 DIAGNOSIS — K029 Dental caries, unspecified: Secondary | ICD-10-CM

## 2021-11-12 LAB — POCT GLYCOSYLATED HEMOGLOBIN (HGB A1C): Hemoglobin A1C: 8.5 % — AB (ref 4.0–5.6)

## 2021-11-12 MED ORDER — JANUMET 50-1000 MG PO TABS: 1.0000 | ORAL_TABLET | Freq: Two times a day (BID) | ORAL | 1 refills | Status: DC

## 2021-11-12 MED ORDER — EMPAGLIFLOZIN 25 MG PO TABS: 25.0000 mg | ORAL_TABLET | Freq: Every day | ORAL | 1 refills | Status: DC

## 2021-11-12 MED ORDER — GLIMEPIRIDE 4 MG PO TABS: 4.0000 mg | ORAL_TABLET | Freq: Every day | ORAL | 3 refills | Status: DC

## 2021-11-12 NOTE — Progress Notes (Signed)
Interpreter: Claris Pong Subjective:  Patient ID: Sarah Owens, female    DOB: 12/19/1975  Age: 45 y.o. MRN: [id number]  CC: Diabetes   HPI Sarah Owens presents for follow-up of diabetes. Patient does not check blood sugar at home  Compliant with meds - Yes Checking CBGs? No  Fasting avg -   Postprandial average -  Exercising regularly? - walking when Sarah Owens feels like it  Watching carbohydrate intake? - No Neuropathy ? - No Hypoglycemic events - No  - Recovers with :   Pertinent ROS:  Polyuria - No Polydipsia - No Vision problems - No Management of Htn- unremarkable on monotherapy. Denies shortness of breath, headaches, chest pain or lower extremity edema   Medications as noted below. Taking them regularly without complication/adverse reaction being reported today.   History Sarah Owens has a past medical history of Diabetes mellitus without complication ([place]), High cholesterol, and No pertinent past medical history.   Sarah Owens has a past surgical history that includes Wisdom tooth extraction; Cesarean section (N/A, 08/22/2014); and Tubal ligation (Bilateral, 08/22/2014).   Her family history includes Pneumonia in her son.Sarah Owens reports that Sarah Owens has never smoked. Sarah Owens has never used smokeless tobacco. Sarah Owens reports that Sarah Owens does not drink alcohol and does not use drugs.  Current Outpatient Medications on File Prior to Visit  Medication Sig Dispense Refill   Accu-Chek Softclix Lancets lancets Check blood glucose twice daily before meals. 100 each 11   acetaminophen (TYLENOL) 500 MG tablet Take 500 mg by mouth every 6 (six) hours as needed for mild pain or headache.     amoxicillin-clavulanate (AUGMENTIN) 875-125 MG tablet Take 1 tablet by mouth every 12 (twelve) hours. 14 tablet 0   atorvastatin (LIPITOR) 10 MG tablet Take 1 tablet (10 mg total) by mouth daily. 90 tablet 3   blood glucose meter kit and supplies Dispense based on patient and insurance preference. Use up to four times daily as  directed. (FOR ICD-10 E10.9, E11.9). 1 each 0   Blood Glucose Monitoring Suppl (ACCU-CHEK AVIVA PLUS) w/Device KIT Check blood glucose twice daily before meals 1 kit 0   glucose blood (ACCU-CHEK AVIVA PLUS) test strip Check blood glucose twice daily before meals 100 each 12   lisinopril (ZESTRIL) 20 MG tablet Take 1 tablet (20 mg total) by mouth daily. 90 tablet 3   No current facility-administered medications on file prior to visit.    ROS Comprehensive ROS pertinent and positive and negative noted in HPI  Objective:  BP 126/84 (BP Location: Right Arm, Patient Position: Sitting, Cuff Size: Large)    Pulse 65    Temp 97.9 F (36.6 C) (Temporal)    Ht _0  (1.6 m)    Wt 192 lb 6.4 oz (87.3 kg)    LMP  (Exact Date)    SpO2 100%    BMI 34.08 kg/m   BP Readings from Last 3 Encounters:  11/12/21 126/84  10/28/21 (!) 146/74  08/13/21 132/83    Wt Readings from Last 3 Encounters:  11/12/21 192 lb 6.4 oz (87.3 kg)  08/13/21 192 lb 12.8 oz (87.5 kg)  05/13/21 186 lb 12.8 oz (84.7 kg)   General: Vital signs reviewed.  Patient is well-developed and well-nourished, obese female  in no acute distress and cooperative with exam. Head: Normocephalic and atraumatic. Eyes: EOMI, conjunctivae normal, no scleral icterus. Neck: Supple, trachea midline, normal ROM, no JVD, masses, thyromegaly, or carotid bruit present. Cardiovascular: RRR, S1 normal, S2 normal, no murmurs, gallops, or rubs. Pulmonary/Chest:  Clear to auscultation bilaterally, no wheezes, rales, or rhonchi. Abdominal: Soft, non-tender, non-distended, BS +, no masses, organomegaly, or guarding present. Musculoskeletal: No joint deformities, erythema, or stiffness, ROM full and nontender. Extremities: No lower extremity edema bilaterally,  pulses symmetric and intact bilaterally. No cyanosis or clubbing. Neurological: A&O x3, Strength is normal Skin: Warm, dry and intact. No rashes or erythema. Psychiatric: Normal mood and affect.  speech and behavior is normal. Cognition and memory are normal.    Lab Results  Component Value Date   HGBA1C 8.5 (A) 11/12/2021   HGBA1C 8.8 (A) 08/13/2021   HGBA1C 9.0 (A) 05/13/2021    Lab Results  Component Value Date   WBC 4.4 02/26/2021   HGB 13.8 02/26/2021   HCT 42.7 02/26/2021   PLT 251 02/26/2021   GLUCOSE 172 (H) 02/26/2021   CHOL 181 08/13/2021   TRIG 49 08/13/2021   HDL 50 08/13/2021   LDLCALC 121 (H) 08/13/2021   ALT 15 02/26/2021   AST 18 02/26/2021   NA 135 02/26/2021   K 4.7 02/26/2021   CL 99 02/26/2021   CREATININE 0.81 02/26/2021   BUN 9 02/26/2021   CO2 24 02/26/2021   TSH 2.454 04/26/2020   HGBA1C 8.5 (A) 11/12/2021     Assessment & Plan:   Sarah Owens was seen today for diabetes.  Diagnoses and all orders for this visit:  Type 2 diabetes mellitus without complication, without long-term current use of insulin (HCC) -     HgB A1c 8.5 change diet counting out of sweets. Continue to monitor foods that are high in carbohydrates are the following rice, potatoes, breads, sugars, and pastas.  Reduction in the intake (eating) will assist in lowering your blood sugars.  Discussed  co- morbidities with uncontrol diabetes  Complications -diabetic retinopathy, (close your eyes ? What do you see nothing) nephropathy decrease in kidney function- can lead to dialysis-on a machine 3 days a week to filter your kidney, neuropathy- numbness and tinging in your hands and feet,  increase risk of heart attack and stroke, and amputation due to decrease wound healing and circulation. Decrease your risk by taking medication daily as prescribed, monitor carbohydrates- foods that are high in carbohydrates are the following rice, potatoes, breads, sugars, and pastas.  Reduction in the intake (eating) will assist in lowering your blood sugars. Exercise daily at least 30 minutes daily.   Medication refill -     glimepiride (AMARYL) 4 MG tablet; Take 1 tablet (4 mg total) by mouth daily  before breakfast. -     sitaGLIPtin-metformin (JANUMET) 50-1000 MG tablet; Take 1 tablet by mouth 2 (two) times daily with a meal. -     empagliflozin (JARDIANCE) 25 MG TABS tablet; Take 1 tablet (25 mg total) by mouth daily before breakfast.   Pain due to dental caries Patient express embarrassment both of her 2 front teeth were remove and Sarah Owens speaks no [place]. We search the web for partials and cost and places the would accept her insurance. Her son speaks English asked her to have him help her schedule an appt.   I am having Sarah Owens start on empagliflozin. I am also having her maintain her Accu-Chek Aviva Plus, glucose blood, Accu-Chek Softclix Lancets, acetaminophen, blood glucose meter kit and supplies, lisinopril, atorvastatin, amoxicillin-clavulanate, glimepiride, and Janumet.  Meds ordered this encounter  Medications   glimepiride (AMARYL) 4 MG tablet    Sig: Take 1 tablet (4 mg total) by mouth daily before breakfast.    Dispense:  90 tablet    Refill:  3   sitaGLIPtin-metformin (JANUMET) 50-1000 MG tablet    Sig: Take 1 tablet by mouth 2 (two) times daily with a meal.    Dispense:  180 tablet    Refill:  1   empagliflozin (JARDIANCE) 25 MG TABS tablet    Sig: Take 1 tablet (25 mg total) by mouth daily before breakfast.    Dispense:  90 tablet    Refill:  1     Follow-up:   Return in about 3 months (around 02/10/2022) for DM/fasting.  The above assessment and management plan was discussed with the patient. The patient verbalized understanding of and has agreed to the management plan. Patient is aware to call the clinic if symptoms fail to improve or worsen. Patient is aware when to return to the clinic for a follow-up visit. Patient educated on when it is appropriate to go to the emergency department.   Juluis Mire, NP-C

## 2021-11-12 NOTE — Patient Instructions (Signed)
What are the 3 types of partials? Image result Some of the common types of partials include: Acrylic partial dentures. In this type of denture, the dentist will fix the replacement teeth into a pink acrylic base. ... Cast metal partial denture. This partial denture is one of the most common options. ... Flexible partial denture.

## 2021-11-12 NOTE — Progress Notes (Signed)
5

## 2022-02-11 ENCOUNTER — Ambulatory Visit (INDEPENDENT_AMBULATORY_CARE_PROVIDER_SITE_OTHER): Payer: Medicaid Other | Admitting: Primary Care

## 2022-02-11 ENCOUNTER — Other Ambulatory Visit: Payer: Self-pay

## 2022-02-11 ENCOUNTER — Encounter (INDEPENDENT_AMBULATORY_CARE_PROVIDER_SITE_OTHER): Payer: Self-pay | Admitting: Primary Care

## 2022-02-11 VITALS — BP 130/82 | HR 60 | Temp 97.9°F | Ht 60.0 in | Wt 186.2 lb

## 2022-02-11 DIAGNOSIS — E119 Type 2 diabetes mellitus without complications: Secondary | ICD-10-CM | POA: Diagnosis not present

## 2022-02-11 LAB — POCT GLYCOSYLATED HEMOGLOBIN (HGB A1C): Hemoglobin A1C: 7.7 % — AB (ref 4.0–5.6)

## 2022-02-11 MED ORDER — EMPAGLIFLOZIN 25 MG PO TABS: 25.0000 mg | ORAL_TABLET | Freq: Every day | ORAL | 1 refills | Status: DC

## 2022-02-11 NOTE — Progress Notes (Signed)
? ?Subjective:  ?Patient ID: Sarah Owens, female    DOB: 1976/08/22  Age: 46 y.o. MRN: [id number] ? ?CC: Diabetes ? ? ?HPI ?Sarah Owens 46 year old Sarah Owens obese female Sarah Owens [phone]) presents for follow-up of diabetes. Patient does not check blood sugar at home ? ?Compliant with meds - No ?Checking CBGs? No ? Fasting avg -  ? Postprandial average -  ?Exercising regularly? - no ?Watching carbohydrate intake? - No only eats 1 meal a day - we discussed this before she can not eat only 1 meal 6 smalls ?Neuropathy ? - No ?Hypoglycemic events - No ? - Recovers with :  ? ?Pertinent ROS:  ?Polyuria - No ?Polydipsia - No ?Vision problems - No ?Management of HTN-Denies shortness of breath, headaches, chest pain or lower extremity edema  ?Medications as noted below. Taking them regularly without complication/adverse reaction being reported today.  ? ?History ?Sarah Owens has a past medical history of Diabetes mellitus without complication ([place]), High cholesterol, and No pertinent past medical history.  ? ?She has a past surgical history that includes Wisdom tooth extraction; Cesarean section (N/A, 08/22/2014); and Tubal ligation (Bilateral, 08/22/2014).  ? ?Her family history includes Pneumonia in her son.She reports that she has never smoked. She has never used smokeless tobacco. She reports that she does not drink alcohol and does not use drugs. ? ?Current Outpatient Medications on File Prior to Visit  ?Medication Sig Dispense Refill  ? Accu-Chek Softclix Lancets lancets Check blood glucose twice daily before meals. 100 each 11  ? acetaminophen (TYLENOL) 500 MG tablet Take 500 mg by mouth every 6 (six) hours as needed for mild pain or headache.    ? atorvastatin (LIPITOR) 10 MG tablet Take 1 tablet (10 mg total) by mouth daily. 90 tablet 3  ? blood glucose meter kit and supplies Dispense based on patient and insurance preference. Use up to four times daily as directed. (FOR ICD-10 E10.9, E11.9). 1 each 0  ? Blood Glucose  Monitoring Suppl (ACCU-CHEK AVIVA PLUS) w/Device KIT Check blood glucose twice daily before meals 1 kit 0  ? empagliflozin (JARDIANCE) 25 MG TABS tablet Take 1 tablet (25 mg total) by mouth daily before breakfast. 90 tablet 1  ? glimepiride (AMARYL) 4 MG tablet Take 1 tablet (4 mg total) by mouth daily before breakfast. 90 tablet 3  ? glucose blood (ACCU-CHEK AVIVA PLUS) test strip Check blood glucose twice daily before meals 100 each 12  ? lisinopril (ZESTRIL) 20 MG tablet Take 1 tablet (20 mg total) by mouth daily. 90 tablet 3  ? sitaGLIPtin-metformin (JANUMET) 50-1000 MG tablet Take 1 tablet by mouth 2 (two) times daily with a meal. 180 tablet 1  ? ?No current facility-administered medications on file prior to visit.  ? ? ?ROS ?Comprehensive ROS Pertinent positive and negative noted in HPI   ? ?Objective:  ?BP 130/82 (BP Location: Right Arm, Patient Position: Sitting, Cuff Size: Normal)   Pulse 60   Temp 97.9 ?F (36.6 ?C) (Oral)   Ht 5' (1.524 m)   Wt 186 lb 3.2 oz (84.5 kg)   LMP 12/02/2021 (Exact Date)   SpO2 99%   BMI 36.36 kg/m?  ? ?BP Readings from Last 3 Encounters:  ?02/11/22 130/82  ?11/12/21 126/84  ?10/28/21 (!) 146/74  ? ? ?Wt Readings from Last 3 Encounters:  ?02/11/22 186 lb 3.2 oz (84.5 kg)  ?11/12/21 192 lb 6.4 oz (87.3 kg)  ?08/13/21 192 lb 12.8 oz (87.5 kg)  ? ? ?Physical  Exam ?Vitals reviewed.  ?Constitutional:   ?   Appearance: She is obese.  ?HENT:  ?   Head: Normocephalic.  ?   Right Ear: Tympanic membrane and external ear normal.  ?   Left Ear: Tympanic membrane and external ear normal.  ?   Nose: Nose normal.  ?Eyes:  ?   Extraocular Movements: Extraocular movements intact.  ?Cardiovascular:  ?   Rate and Rhythm: Normal rate and regular rhythm.  ?Pulmonary:  ?   Effort: Pulmonary effort is normal.  ?   Breath sounds: Normal breath sounds.  ?Abdominal:  ?   General: Bowel sounds are normal. There is distension.  ?   Palpations: Abdomen is soft.  ?Musculoskeletal:     ?   General:  Normal range of motion.  ?   Cervical back: Normal range of motion and neck supple.  ?Skin: ?   General: Skin is warm and dry.  ?Neurological:  ?   Mental Status: She is alert and oriented to person, place, and time.  ?Psychiatric:     ?   Mood and Affect: Mood normal.     ?   Behavior: Behavior normal.  ? ?Lab Results  ?Component Value Date  ? HGBA1C 8.5 (A) 11/12/2021  ? HGBA1C 8.8 (A) 08/13/2021  ? HGBA1C 9.0 (A) 05/13/2021  ? ? ?Lab Results  ?Component Value Date  ? WBC 4.4 02/26/2021  ? HGB 13.8 02/26/2021  ? HCT 42.7 02/26/2021  ? PLT 251 02/26/2021  ? GLUCOSE 172 (H) 02/26/2021  ? CHOL 181 08/13/2021  ? TRIG 49 08/13/2021  ? HDL 50 08/13/2021  ? LDLCALC 121 (H) 08/13/2021  ? ALT 15 02/26/2021  ? AST 18 02/26/2021  ? NA 135 02/26/2021  ? K 4.7 02/26/2021  ? CL 99 02/26/2021  ? CREATININE 0.81 02/26/2021  ? BUN 9 02/26/2021  ? CO2 24 02/26/2021  ? TSH 2.454 04/26/2020  ? HGBA1C 8.5 (A) 11/12/2021  ? ? ? ?Assessment & Plan:  ? ?Sarah Owens was seen today for diabetes. ? ?Diagnoses and all orders for this visit: ? ?Type 2 diabetes mellitus without complication, without long-term current use of insulin ([place]) ?-     HgB A1c 7.7 improvement from 12/22 8.5 no change in medication- unable to convince her changing in diet 1 meal- FuFu only ?The American Diabetes Association (ADA) has long recommended looser control for people who are more frail. In these official guidelines, they recommend an A1C target of 7.5% for healthy people over 65, as compared to 7.0% for younger people. For people with other illnesses or impairments, their goal is 8.0% . Controlled is 6.5 ? ? ?I have discontinued Maralee Buccheri's amoxicillin-clavulanate. I am also having her maintain her Accu-Chek Aviva Plus, glucose blood, Accu-Chek Softclix Lancets, acetaminophen, blood glucose meter kit and supplies, lisinopril, atorvastatin, glimepiride, Janumet, and empagliflozin. ? ?No orders of the defined types were placed in this encounter. ? ? ? ?Follow-up:   ? ?No follow-ups on file. ? ?The above assessment and management plan was discussed with the patient. The patient verbalized understanding of and has agreed to the management plan. Patient is aware to call the clinic if symptoms fail to improve or worsen. Patient is aware when to return to the clinic for a follow-up visit. Patient educated on when it is appropriate to go to the emergency department.  ? ?Juluis Mire, NP-C ? ?  ?

## 2022-03-24 ENCOUNTER — Ambulatory Visit (INDEPENDENT_AMBULATORY_CARE_PROVIDER_SITE_OTHER): Payer: Medicaid Other | Admitting: Primary Care

## 2022-03-24 ENCOUNTER — Encounter (INDEPENDENT_AMBULATORY_CARE_PROVIDER_SITE_OTHER): Payer: Self-pay | Admitting: Primary Care

## 2022-03-24 VITALS — BP 128/87 | HR 63 | Temp 97.5°F | Ht 60.0 in | Wt 189.4 lb

## 2022-03-24 DIAGNOSIS — H5789 Other specified disorders of eye and adnexa: Secondary | ICD-10-CM

## 2022-03-24 MED ORDER — LORATADINE 10 MG PO TABS
10.0000 mg | ORAL_TABLET | Freq: Every day | ORAL | 1 refills | Status: DC
Start: 2022-03-24 — End: 2023-01-27

## 2022-03-24 NOTE — Progress Notes (Signed)
? ? ? ?   [facility] ? ? ? ?Subjective:  ?  ?Sarah Owens is a 46 y.o. [place] obese female (interpreter Renold Don [phone])  who presents for evaluation of itchy eyes.  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  This happen 2 weeks ago. The chemicals from the chicken went into her eyes they  have been burning itching and feeling like sand every since this took place.  She tried washing her eyes out with water but she has not had any relief.  Symptoms include: red ,itchy eyes, feels like in both eyes have sand in them and watery eyes. Onset of symptoms was 2 weeks ago. Symptoms have been unchanged since that time. Patient feels chemicals have entered her eyes causing the problem. She did not report incident at work therefore no documentation.  Patient has No headache, No chest pain, No abdominal pain - No Nausea, No new weakness tingling or numbness, No Cough - shortness of breath ?The following portions of the patient's history were reviewed and updated as appropriate: allergies, current medications, past family history, past medical history, past social history, and past surgical history. ? ?Review of Systems ?Pertinent items noted in HPI and remainder of comprehensive ROS otherwise negative.  ? ?Objective:  ? ? BP 128/87 (BP Location: Right Arm, Patient Position: Sitting, Cuff Size: Normal)   Pulse 63   Temp (!) 97.5 ?F (36.4 ?C) (Oral)   Ht 5' (1.524 m)   Wt 189 lb 6.4 oz (85.9 kg)   SpO2 99%   BMI 36.99 kg/m?  ?General appearance: alert, cooperative, and moderately obese ?Head: Normocephalic, without obvious abnormality, atraumatic ?Eyes: conjunctivae/corneas red. PERRL, EOM's intact.  ?Ears: normal TM's and external ear canals both ears ?Neck: no adenopathy, no carotid bruit, no JVD, supple, symmetrical, trachea midline, and thyroid not enlarged, symmetric, no tenderness/mass/nodules ?Lungs: clear to auscultation  bilaterally ?Heart: regular rate and rhythm, S1, S2 normal, no murmur, click, rub or gallop ?Abdomen: soft, non-tender; bowel sounds normal; no masses,  no organomegaly ?Extremities: extremities normal, atraumatic, no cyanosis or edema ?Skin: Skin color, texture, turgor normal. No rashes or lesions ?Lymph nodes: Cervical, supraclavicular, and axillary nodes normal.  ?  ?Assessment:  ? ? Sarah Owens was seen today for allergies. ? ?Diagnoses and all orders for this visit: ? ?Eye irritation ?Irritant from chemicals per patient denies allergies  ?-     Ambulatory referral to Optometry ? ?This note has been created with Education officer, environmental. Any transcriptional errors are unintentional.  ? ?Grayce Sessions, NP ?03/24/2022, 3:14 PM  ?

## 2022-05-14 ENCOUNTER — Ambulatory Visit (INDEPENDENT_AMBULATORY_CARE_PROVIDER_SITE_OTHER): Payer: Medicaid Other | Admitting: Primary Care

## 2022-06-10 IMAGING — CT CT RENAL STONE PROTOCOL
2 of 4 series · 17 of 46 positions shown, 19 images · non-contrast
Comparison: 04/16/2017

CLINICAL DATA: Right-sided flank pain and hematuria

EXAM:
CT ABDOMEN AND PELVIS WITHOUT CONTRAST
TECHNIQUE: Multidetector CT imaging of the abdomen and pelvis was performed
following the standard protocol without IV contrast.

[Series 3: ap without · axial · non-contrast · 0.71mm/px · z∈[-495,-120]mm · 14 of 85 slices shown, 16 images]
[im 5/85  soft-tissue]
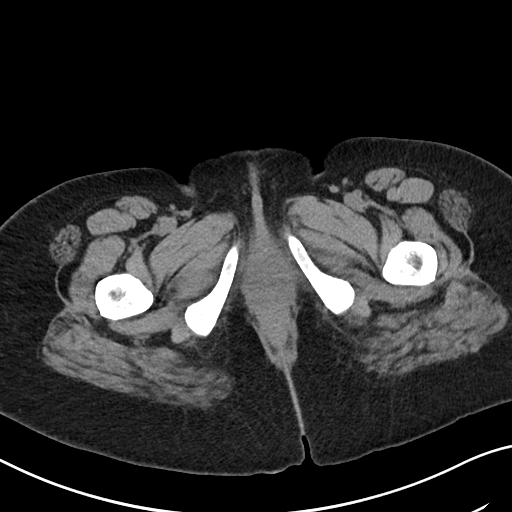
[im 5/85  bone]
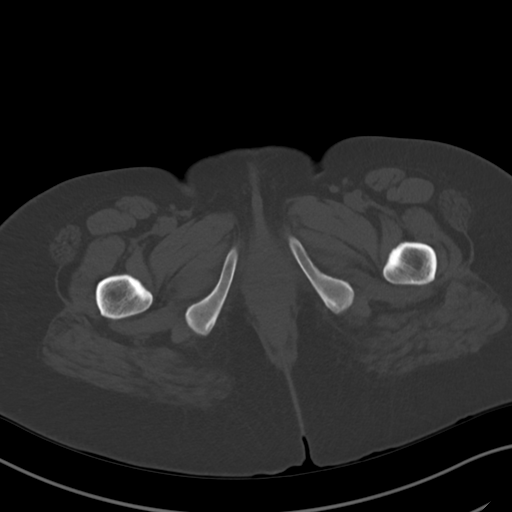
[im 10/85  soft-tissue]
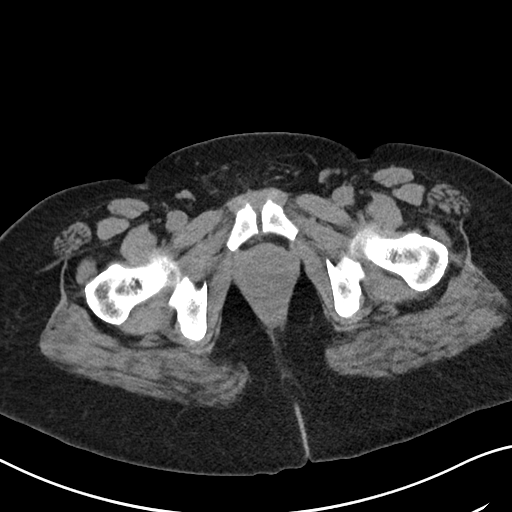
[im 19/85  soft-tissue]
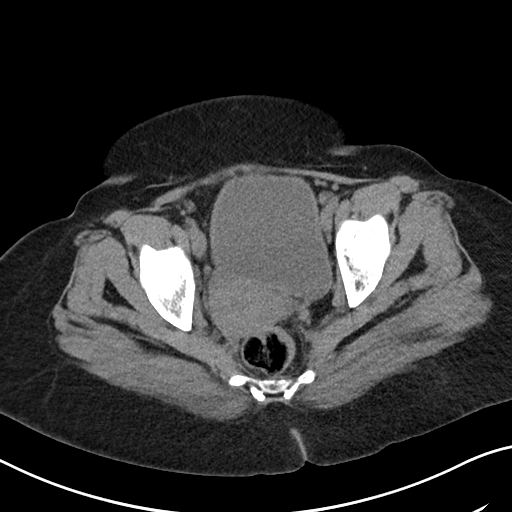
[im 24/85  soft-tissue]
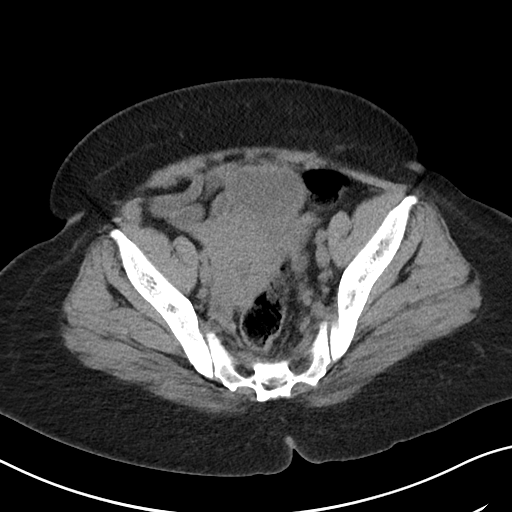
[im 29/85  soft-tissue]
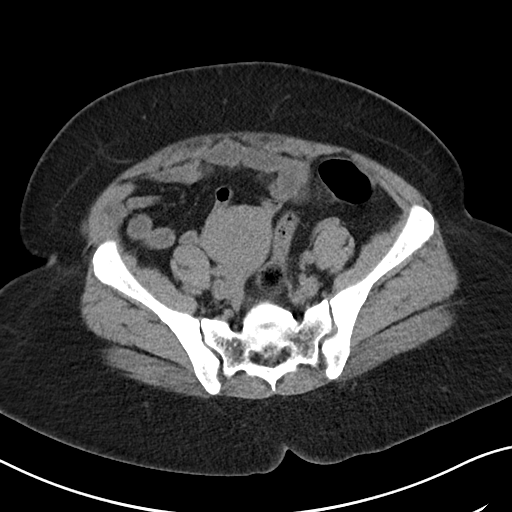
[im 33/85  soft-tissue]
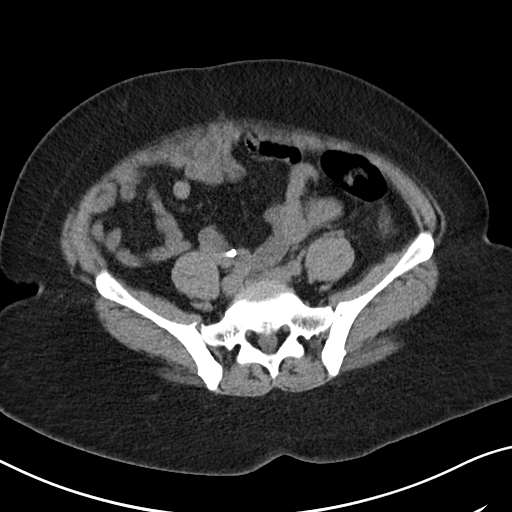
[im 38/85  soft-tissue]
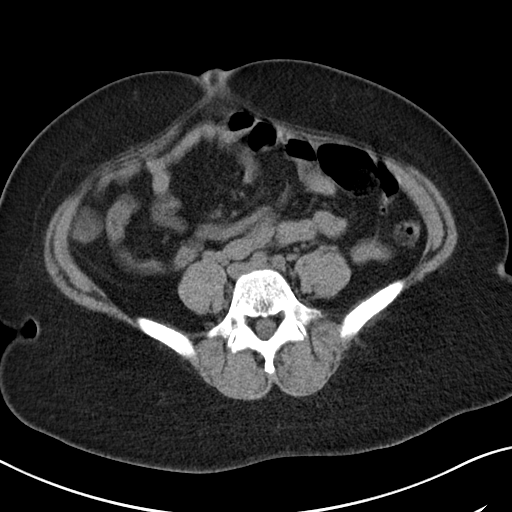
[im 47/85  soft-tissue]
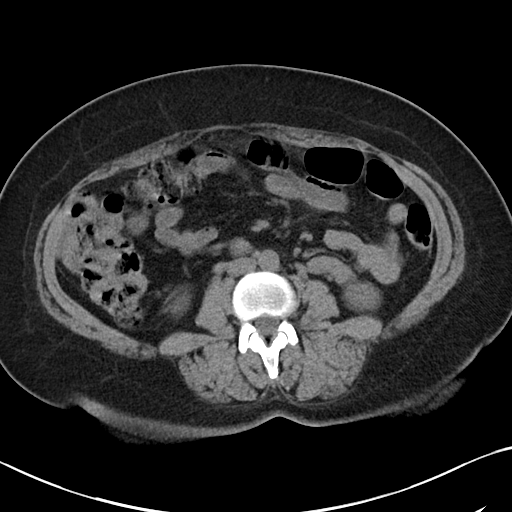
[im 52/85  soft-tissue]
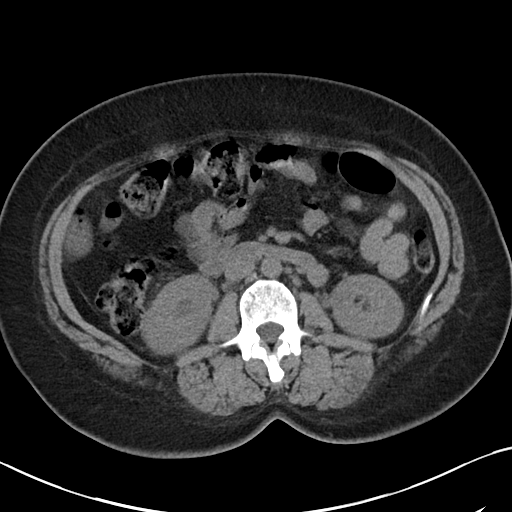
[im 52/85  bone]
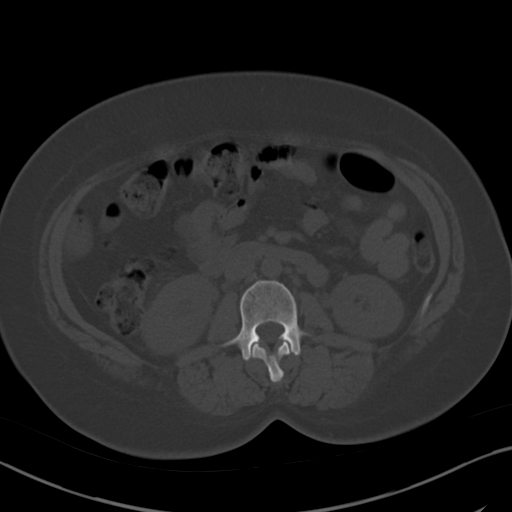
[im 57/85  soft-tissue]
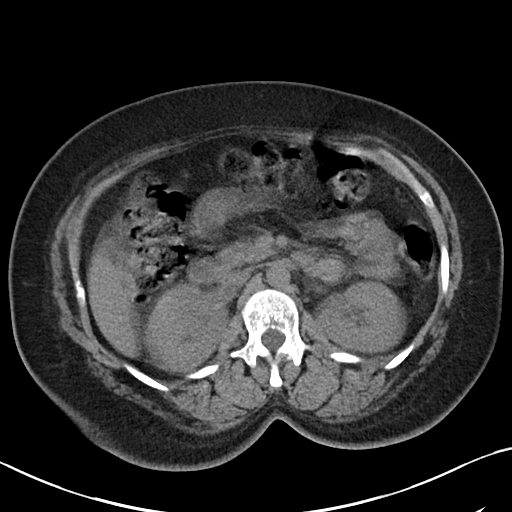
[im 61/85  soft-tissue]
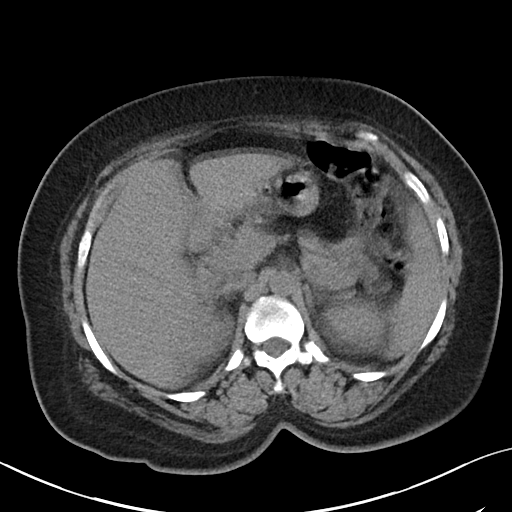
[im 66/85  soft-tissue]
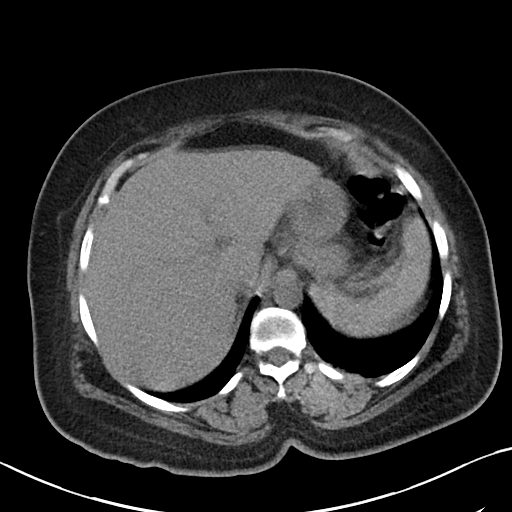
[im 75/85  soft-tissue]
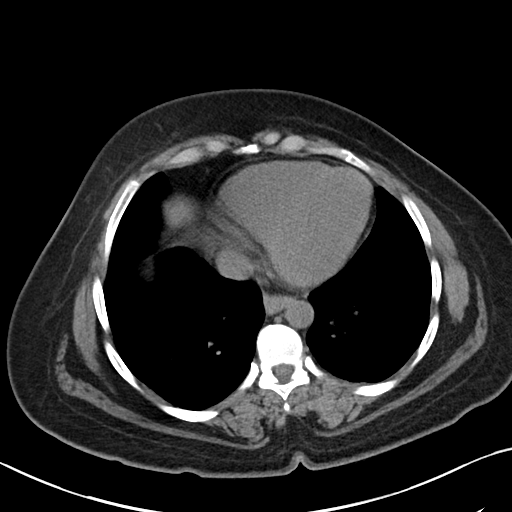
[im 80/85  soft-tissue]
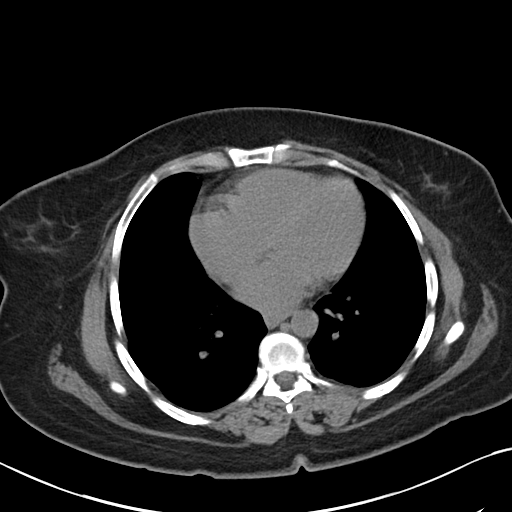

[Series 6: cor · coronal · 0.72mm/px · 3 of 100 slices shown]
[im 34/100  soft-tissue]
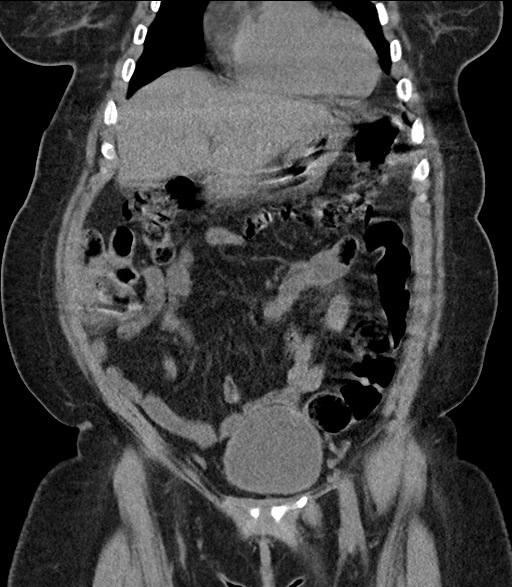
[im 45/100  soft-tissue]
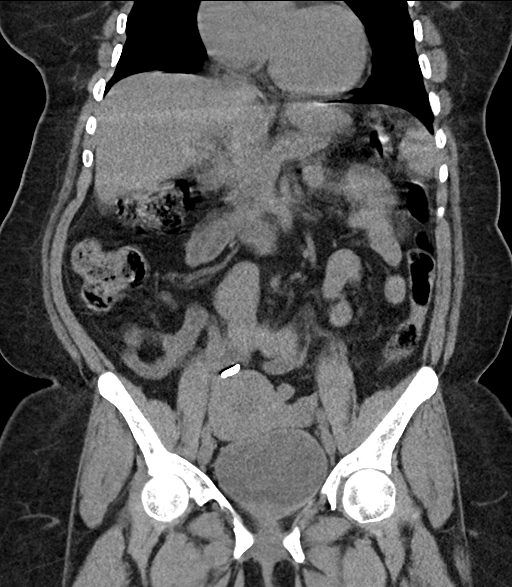
[im 56/100  soft-tissue]
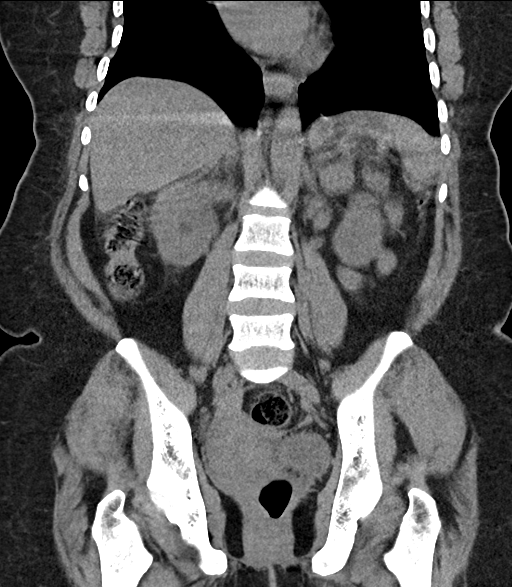

[17 of 46 positions shown; findings below may reference images not displayed]

FINDINGS: Lower chest: No acute abnormality.

Hepatobiliary: Liver appears within normal limits. The gallbladder
is not well visualized somewhat limited by patient motion artifact.
Correlation with clinical history is recommended.

Pancreas: Unremarkable. No pancreatic ductal dilatation or
surrounding inflammatory changes.

Spleen: Normal in size without focal abnormality.

Adrenals/Urinary Tract: Adrenal glands are within normal limits.
Kidneys demonstrate no renal calculi obstructive changes. The
bladder is partially distended.

Stomach/Bowel: The appendix is within normal limits. No obstructive
or inflammatory changes of the colon or small bowel are seen. Small
sliding-type hiatal hernia is noted.

Vascular/Lymphatic: No significant vascular findings are present. No
enlarged abdominal or pelvic lymph nodes.

Reproductive: Changes of prior tubal ligation are noted. Uterus and
adnexa are otherwise within normal limits.

Other: No abdominal wall hernia or abnormality. No abdominopelvic
ascites.

Musculoskeletal: No acute or significant osseous findings.
IMPRESSION: No acute abnormality is noted to correspond with the patient's given
clinical history.

## 2022-06-30 ENCOUNTER — Ambulatory Visit (INDEPENDENT_AMBULATORY_CARE_PROVIDER_SITE_OTHER): Payer: Medicaid Other | Admitting: Primary Care

## 2022-07-29 ENCOUNTER — Encounter (INDEPENDENT_AMBULATORY_CARE_PROVIDER_SITE_OTHER): Payer: Self-pay | Admitting: Primary Care

## 2022-07-29 ENCOUNTER — Ambulatory Visit (INDEPENDENT_AMBULATORY_CARE_PROVIDER_SITE_OTHER): Payer: Medicaid Other | Admitting: Primary Care

## 2022-07-29 VITALS — BP 126/86 | HR 51 | Temp 98.1°F | Ht 60.0 in | Wt 184.8 lb

## 2022-07-29 DIAGNOSIS — I10 Essential (primary) hypertension: Secondary | ICD-10-CM

## 2022-07-29 DIAGNOSIS — E1165 Type 2 diabetes mellitus with hyperglycemia: Secondary | ICD-10-CM

## 2022-07-29 DIAGNOSIS — Z760 Encounter for issue of repeat prescription: Secondary | ICD-10-CM | POA: Diagnosis not present

## 2022-07-29 DIAGNOSIS — E119 Type 2 diabetes mellitus without complications: Secondary | ICD-10-CM

## 2022-07-29 DIAGNOSIS — J9801 Acute bronchospasm: Secondary | ICD-10-CM | POA: Diagnosis not present

## 2022-07-29 LAB — POCT GLYCOSYLATED HEMOGLOBIN (HGB A1C): Hemoglobin A1C: 9.2 % — AB (ref 4.0–5.6)

## 2022-07-29 MED ORDER — GLIMEPIRIDE 4 MG PO TABS: 4.0000 mg | ORAL_TABLET | Freq: Every day | ORAL | 1 refills | Status: DC

## 2022-07-29 MED ORDER — JANUMET 50-1000 MG PO TABS: 1.0000 | ORAL_TABLET | Freq: Two times a day (BID) | ORAL | 1 refills | Status: DC

## 2022-07-29 MED ORDER — ALBUTEROL SULFATE HFA 108 (90 BASE) MCG/ACT IN AERS: 2.0000 | INHALATION_SPRAY | Freq: Four times a day (QID) | RESPIRATORY_TRACT | 2 refills | Status: DC | PRN

## 2022-07-29 MED ORDER — EMPAGLIFLOZIN 25 MG PO TABS: 25.0000 mg | ORAL_TABLET | Freq: Every day | ORAL | 1 refills | Status: DC

## 2022-07-29 MED ORDER — LISINOPRIL 20 MG PO TABS: 20.0000 mg | ORAL_TABLET | Freq: Every day | ORAL | 1 refills | Status: DC

## 2022-07-29 NOTE — Progress Notes (Unsigned)
Sarah Owens is a 46 y.o. female presents for hypertension evaluation, Denies shortness of breath, headaches, chest pain or lower extremity edema, sudden onset, vision changes, unilateral weakness, dizziness, paresthesias  Management of Type 2 diabetes she denies polyuria, polydipsia, polyphagia or vision changes. Patient reports adherence with medications requesting refills. She is coughing asked when started yesterday she got too cold at work.  Deatra Canter [id number]  Past Medical History:  Diagnosis Date   Diabetes mellitus without complication ([place])    High cholesterol    No pertinent past medical history    Past Surgical History:  Procedure Laterality Date   CESAREAN SECTION N/A 08/22/2014   Procedure: CESAREAN SECTION;  Surgeon: Allyn Kenner, DO;  Location: [facility];  Service: Obstetrics;  Laterality: N/A;, for breech presentation.   TUBAL LIGATION Bilateral 08/22/2014   Procedure: BILATERAL TUBAL LIGATION;  Surgeon: Allyn Kenner, DO;  Location: [facility];  Service: Obstetrics;  Laterality: Bilateral;   WISDOM TOOTH EXTRACTION     No Known Allergies Current Outpatient Medications on File Prior to Visit  Medication Sig Dispense Refill   Accu-Chek Softclix Lancets lancets Check blood glucose twice daily before meals. 100 each 11   acetaminophen (TYLENOL) 500 MG tablet Take 500 mg by mouth every 6 (six) hours as needed for mild pain or headache.     atorvastatin (LIPITOR) 10 MG tablet Take 1 tablet (10 mg total) by mouth daily. 90 tablet 3   blood glucose meter kit and supplies Dispense based on patient and insurance preference. Use up to four times daily as directed. (FOR ICD-10 E10.9, E11.9). 1 each 0   Blood Glucose Monitoring Suppl (ACCU-CHEK AVIVA PLUS) w/Device KIT Check blood glucose twice daily before meals 1 kit 0   empagliflozin (JARDIANCE) 25 MG TABS tablet Take 1 tablet (25 mg total) by mouth daily before breakfast. 90 tablet 1   glimepiride (AMARYL) 4 MG  tablet Take 1 tablet (4 mg total) by mouth daily before breakfast. 90 tablet 3   glucose blood (ACCU-CHEK AVIVA PLUS) test strip Check blood glucose twice daily before meals 100 each 12   lisinopril (ZESTRIL) 20 MG tablet Take 1 tablet (20 mg total) by mouth daily. 90 tablet 3   loratadine (CLARITIN) 10 MG tablet Take 1 tablet (10 mg total) by mouth daily. 90 tablet 1   sitaGLIPtin-metformin (JANUMET) 50-1000 MG tablet Take 1 tablet by mouth 2 (two) times daily with a meal. 180 tablet 1   No current facility-administered medications on file prior to visit.   Social History   Socioeconomic History   Marital status: Married    Spouse name: Osie Cheeks   Number of children: 5   Years of education: 8   Highest education level: 8th grade  Occupational History   Not on file  Tobacco Use   Smoking status: Never   Smokeless tobacco: Never  Vaping Use   Vaping Use: Never used  Substance and Sexual Activity   Alcohol use: No   Drug use: No   Sexual activity: Yes    Birth control/protection: Surgical  Other Topics Concern   Not on file  Social History Narrative   Lives at home with husband, Osie Cheeks (listed as Osie Cheeks), who has significant health issues, as well as her 5 children and some older adult step children.         Social Determinants of Health   Financial Resource Strain: Not on file  Food Insecurity: No Food Insecurity (04/05/2019)  Hunger Vital Sign    Worried About Running Out of Food in the Last Year: Never true    Ran Out of Food in the Last Year: Never true  Transportation Needs: No Transportation Needs (04/05/2019)   PRAPARE - Hydrologist (Medical): No    Lack of Transportation (Non-Medical): No  Physical Activity: Not on file  Stress: Not on file  Social Connections: Unknown (04/05/2019)   Social Connection and Isolation Panel [NHANES]    Frequency of Communication with Friends and Family: Not on file    Frequency of Social  Gatherings with Friends and Family: Not on file    Attends Religious Services: Never    Marine scientist or Organizations: Not on file    Attends Archivist Meetings: Not on file    Marital Status: Not on file  Intimate Partner Violence: Not on file   Family History  Problem Relation Age of Onset   Pneumonia Son      OBJECTIVE:  Vitals:   07/29/22 1002  BP: 126/86  Pulse: (!) 51  Temp: 98.1 F (36.7 C)  TempSrc: Oral  SpO2: 99%  Weight: 184 lb 12.8 oz (83.8 kg)  Height: 5' (1.524 m)    Physical Exam Vitals reviewed.  Constitutional:      Appearance: Normal appearance. She is obese.  HENT:     Head: Normocephalic.     Right Ear: External ear normal.     Left Ear: External ear normal.     Nose: Nose normal.  Eyes:     Extraocular Movements: Extraocular movements intact.     Pupils: Pupils are equal, round, and reactive to light.  Cardiovascular:     Rate and Rhythm: Normal rate and regular rhythm.  Pulmonary:     Effort: Pulmonary effort is normal.     Breath sounds: Wheezing present.  Abdominal:     General: Bowel sounds are normal. There is distension.     Palpations: Abdomen is soft.  Musculoskeletal:        General: Normal range of motion.     Cervical back: Normal range of motion and neck supple.  Skin:    General: Skin is warm and dry.  Neurological:     Mental Status: She is oriented to person, place, and time.  Psychiatric:        Mood and Affect: Mood normal.        Behavior: Behavior normal.     ROS Comprehensive ROS Pertinent positive and negative noted in HPI   Last 3 Office BP readings: BP Readings from Last 3 Encounters:  07/29/22 126/86  03/24/22 128/87  02/11/22 130/82    BMET    Component Value Date/Time   NA 135 02/26/2021 1613   K 4.7 02/26/2021 1613   CL 99 02/26/2021 1613   CO2 24 02/26/2021 1613   GLUCOSE 172 (H) 02/26/2021 1613   GLUCOSE 235 (H) 07/09/2020 2157   BUN 9 02/26/2021 1613   CREATININE 0.81  02/26/2021 1613   CALCIUM 10.8 (H) 02/26/2021 1613   GFRNONAA >60 07/09/2020 2157   GFRAA >60 07/09/2020 2157    Renal function: CrCl cannot be calculated (Patient's most recent lab result is older than the maximum 21 days allowed.).  Clinical ASCVD: {YES/NO:21197} The 10-year ASCVD risk score (Arnett DK, et al., 2019) is: 5.5%   Values used to calculate the score:     Age: 80 years     Sex: Female  Is Non-Hispanic African American: Yes     Diabetic: Yes     Tobacco smoker: No     Systolic Blood Pressure: 816 mmHg     Is BP treated: Yes     HDL Cholesterol: 50 mg/dL     Total Cholesterol: 181 mg/dL  ASCVD risk factors include- [place]   ASSESSMENT & PLAN:  1. Type 2 diabetes mellitus without complication, without long-term current use of insulin ([facility])     No orders of the defined types were placed in this encounter.   -Counseled on lifestyle modifications for blood pressure control including reduced dietary sodium, increased exercise, weight reduction and adequate sleep. Also, educated patient about the risk for cardiovascular events, stroke and heart attack. Also counseled patient about the importance of medication adherence. If you participate in smoking, it is important to stop using tobacco as this will increase the risks associated with uncontrolled blood pressure.   -Hypertension longstanding/newly diagnosed currently *** on current medications. Patient {Is/is not:9024} adherent with current medications.   Goal BP:  For patients younger than 60: Goal BP < 130/80. For patients 60 and older: Goal BP < 140/90. For patients with diabetes: Goal BP < 130/80. Your most recent BP: ***  Minimize salt intake. Minimize alcohol intake    This note has been created with Surveyor, quantity. Any transcriptional errors are unintentional.   Kerin Perna, NP 07/29/2022, 10:09 AM

## 2022-07-30 LAB — COMPREHENSIVE METABOLIC PANEL
ALT: 18 IU/L (ref 0–32)
AST: 21 IU/L (ref 0–40)
Albumin/Globulin Ratio: 1.5 (ref 1.2–2.2)
Albumin: 4.5 g/dL (ref 3.9–4.9)
Alkaline Phosphatase: 104 IU/L (ref 44–121)
BUN/Creatinine Ratio: 11 (ref 9–23)
BUN: 8 mg/dL (ref 6–24)
Bilirubin Total: 0.3 mg/dL (ref 0.0–1.2)
CO2: 25 mmol/L (ref 20–29)
Calcium: 10.8 mg/dL — ABNORMAL HIGH (ref 8.7–10.2)
Chloride: 102 mmol/L (ref 96–106)
Creatinine, Ser: 0.74 mg/dL (ref 0.57–1.00)
Globulin, Total: 3.1 g/dL (ref 1.5–4.5)
Glucose: 240 mg/dL — ABNORMAL HIGH (ref 70–99)
Potassium: 4.4 mmol/L (ref 3.5–5.2)
Sodium: 138 mmol/L (ref 134–144)
Total Protein: 7.6 g/dL (ref 6.0–8.5)
eGFR: 102 mL/min/{1.73_m2} (ref >59)

## 2022-07-30 LAB — CBC WITH DIFFERENTIAL/PLATELET
Basophils Absolute: 0 10*3/uL (ref 0.0–0.2)
Basos: 1 %
EOS (ABSOLUTE): 0.1 10*3/uL (ref 0.0–0.4)
Eos: 2 %
Hematocrit: 42.5 % (ref 34.0–46.6)
Hemoglobin: 14.2 g/dL (ref 11.1–15.9)
Immature Grans (Abs): 0 10*3/uL (ref 0.0–0.1)
Immature Granulocytes: 0 %
Lymphocytes Absolute: 1.4 10*3/uL (ref 0.7–3.1)
Lymphs: 51 %
MCH: 29.1 pg (ref 26.6–33.0)
MCHC: 33.4 g/dL (ref 31.5–35.7)
MCV: 87 fL (ref 79–97)
Monocytes Absolute: 0.3 10*3/uL (ref 0.1–0.9)
Monocytes: 9 %
Neutrophils Absolute: 1.1 10*3/uL — ABNORMAL LOW (ref 1.4–7.0)
Neutrophils: 37 %
Platelets: 172 10*3/uL (ref 150–450)
RBC: 4.88 x10E6/uL (ref 3.77–5.28)
RDW: 13.6 % (ref 11.7–15.4)
WBC: 2.8 10*3/uL — ABNORMAL LOW (ref 3.4–10.8)

## 2022-07-30 LAB — LIPID PANEL
Chol/HDL Ratio: 3.7 ratio (ref 0.0–4.4)
Cholesterol, Total: 164 mg/dL (ref 100–199)
HDL: 44 mg/dL (ref >39)
LDL Chol Calc (NIH): 104 mg/dL — ABNORMAL HIGH (ref 0–99)
Triglycerides: 82 mg/dL (ref 0–149)
VLDL Cholesterol Cal: 16 mg/dL (ref 5–40)

## 2022-08-13 ENCOUNTER — Telehealth (INDEPENDENT_AMBULATORY_CARE_PROVIDER_SITE_OTHER): Payer: Self-pay

## 2022-08-13 NOTE — Telephone Encounter (Signed)
[employer] Karmen Stabs  Id# [phone]  contacted pt to go over lab results  pt is aware and doesn't have any questions or concerns

## 2022-10-29 ENCOUNTER — Ambulatory Visit (INDEPENDENT_AMBULATORY_CARE_PROVIDER_SITE_OTHER): Payer: Medicaid Other | Admitting: Primary Care

## 2023-01-27 ENCOUNTER — Ambulatory Visit (INDEPENDENT_AMBULATORY_CARE_PROVIDER_SITE_OTHER): Payer: Medicaid Other | Admitting: Family

## 2023-01-27 ENCOUNTER — Encounter: Payer: Self-pay | Admitting: Family

## 2023-01-27 VITALS — BP 138/80 | HR 53 | Temp 98.0°F | Resp 20 | Ht 60.0 in | Wt 186.6 lb

## 2023-01-27 DIAGNOSIS — Z1159 Encounter for screening for other viral diseases: Secondary | ICD-10-CM | POA: Diagnosis not present

## 2023-01-27 DIAGNOSIS — Z7689 Persons encountering health services in other specified circumstances: Secondary | ICD-10-CM | POA: Diagnosis not present

## 2023-01-27 DIAGNOSIS — E785 Hyperlipidemia, unspecified: Secondary | ICD-10-CM | POA: Diagnosis not present

## 2023-01-27 DIAGNOSIS — E1169 Type 2 diabetes mellitus with other specified complication: Secondary | ICD-10-CM | POA: Diagnosis not present

## 2023-01-27 DIAGNOSIS — J9801 Acute bronchospasm: Secondary | ICD-10-CM

## 2023-01-27 DIAGNOSIS — Z6836 Body mass index (BMI) 36.0-36.9, adult: Secondary | ICD-10-CM

## 2023-01-27 DIAGNOSIS — Z1211 Encounter for screening for malignant neoplasm of colon: Secondary | ICD-10-CM

## 2023-01-27 DIAGNOSIS — I10 Essential (primary) hypertension: Secondary | ICD-10-CM | POA: Diagnosis not present

## 2023-01-27 DIAGNOSIS — E119 Type 2 diabetes mellitus without complications: Secondary | ICD-10-CM | POA: Diagnosis not present

## 2023-01-27 DIAGNOSIS — E66812 Obesity, class 2: Secondary | ICD-10-CM

## 2023-01-27 MED ORDER — LISINOPRIL 20 MG PO TABS: 20.0000 mg | ORAL_TABLET | Freq: Every day | ORAL | 1 refills | Status: DC

## 2023-01-27 MED ORDER — JANUMET 50-1000 MG PO TABS: 1.0000 | ORAL_TABLET | Freq: Two times a day (BID) | ORAL | 1 refills | Status: DC

## 2023-01-27 MED ORDER — GLIMEPIRIDE 4 MG PO TABS: 4.0000 mg | ORAL_TABLET | Freq: Every day | ORAL | 1 refills | Status: DC

## 2023-01-27 MED ORDER — ATORVASTATIN CALCIUM 10 MG PO TABS: 10.0000 mg | ORAL_TABLET | Freq: Every day | ORAL | 3 refills | Status: DC

## 2023-01-27 MED ORDER — LORATADINE 10 MG PO TABS: 10.0000 mg | ORAL_TABLET | Freq: Every day | ORAL | 1 refills | Status: DC

## 2023-01-27 MED ORDER — ALBUTEROL SULFATE HFA 108 (90 BASE) MCG/ACT IN AERS: 2.0000 | INHALATION_SPRAY | Freq: Four times a day (QID) | RESPIRATORY_TRACT | 2 refills | Status: DC | PRN

## 2023-01-27 MED ORDER — EMPAGLIFLOZIN 25 MG PO TABS: 25.0000 mg | ORAL_TABLET | Freq: Every day | ORAL | 1 refills | Status: DC

## 2023-01-27 NOTE — Progress Notes (Signed)
Provider: Makylah Bossard FNP-C   Kerin Perna, NP  Patient Care Team: Kerin Perna, NP as PCP - General (Internal Medicine)  Extended Emergency Contact Information Primary Emergency Contact: Pampo,Joseph Address: [address]          [place], [place] [address] [place] Phone: [phone] Relation: Spouse Secondary Emergency Contact: Bettey Costa,  [place] Phone: [phone] Relation: Relative  Code Status:  Full Code  Goals of care: Advanced Directive information    07/09/2020    9:43 PM  Advanced Directives  Does Patient Have a Medical Advance Directive? No  Would patient like information on creating a medical advance directive? No - Patient declined     Chief Complaint  Patient presents with   New Patient (Initial Visit)    Patient presents today for a new patient appointment.    HPI:  Pt is a 47 y.o. female seen today establish care here at [facility] for medical management of chronic diseases.she is here with Husband and daughter.Has a medical history of Type 2 diabetes,Hypertension, hyperlipidemia,bronchospasm uses albuterol PRN.    She does not check her blood sugar at home.denies any symptoms of hypo/hyperglycemia.Has not seen ophthalmology.   No home blood pressure readings for review.she denies any headache,dizziness,vision changes,fatigue,chest tightness,palpitation,chest pain or shortness of breath.   She due for Tdap states has not had it over 10 yrs.agrees to get Tdap today.  Also due for pap smear will schedule appointment.     Past Medical History:  Diagnosis Date   Diabetes mellitus without complication ([place])    Hyperlipidemia    Hypertension    No pertinent past medical history    Past Surgical History:  Procedure Laterality Date   CESAREAN SECTION N/A 08/22/2014   Procedure: CESAREAN SECTION;  Surgeon: Allyn Kenner, DO;  Location: [place];  Service: Obstetrics;  Laterality: N/A;, for breech presentation.   TUBAL LIGATION Bilateral 08/22/2014   Procedure: BILATERAL TUBAL LIGATION;  Surgeon: Allyn Kenner, DO;  Location: [facility];  Service: Obstetrics;  Laterality: Bilateral;   WISDOM TOOTH EXTRACTION      No Known Allergies  Allergies as of 01/27/2023   No Known Allergies      Medication List        Accurate as of January 27, 2023  9:08 AM. If you have any questions, ask your nurse or doctor.          Accu-Chek Aviva Plus w/Device Kit Check blood glucose twice daily before meals   Accu-Chek Softclix Lancets lancets Check blood glucose twice daily before meals.   acetaminophen 500 MG tablet Commonly known as: TYLENOL Take 500 mg by mouth every 6 (six) hours as needed for mild pain or headache.   albuterol 108 (90 Base) MCG/ACT inhaler Commonly known as: VENTOLIN HFA Inhale 2 puffs into the lungs every 6 (six) hours as needed for wheezing or shortness of breath.   atorvastatin 10 MG tablet Commonly known as: LIPITOR Take 1 tablet (10 mg total) by mouth daily.   blood glucose meter kit and supplies Dispense based on patient and insurance preference. Use up to four times daily as directed. (FOR ICD-10 E10.9, E11.9).   empagliflozin 25 MG Tabs tablet Commonly known as: Jardiance Take 1 tablet (25 mg total) by mouth daily before breakfast.   glimepiride 4 MG tablet Commonly known as: AMARYL Take 1 tablet (  4 mg total) by mouth daily before breakfast.   glucose blood test strip Commonly known as: Accu-Chek Aviva Plus Check blood glucose twice daily before meals   Janumet 50-1000 MG tablet Generic drug: sitaGLIPtin-metformin Take 1 tablet by mouth 2 (two) times daily with a meal.   lisinopril 20 MG tablet Commonly known as: ZESTRIL Take 1 tablet (20 mg total) by mouth daily.   loratadine 10 MG tablet Commonly known as: CLARITIN Take 1 tablet (10 mg total) by mouth daily.        Review  of Systems  Constitutional:  Negative for appetite change, chills, fatigue, fever and unexpected weight change.  HENT:  Negative for congestion, dental problem, ear discharge, ear pain, facial swelling, hearing loss, nosebleeds, postnasal drip, rhinorrhea, sinus pressure, sinus pain, sneezing, sore throat, tinnitus and trouble swallowing.   Eyes:  Negative for pain, discharge, redness, itching and visual disturbance.  Respiratory:  Negative for cough, chest tightness, shortness of breath and wheezing.   Cardiovascular:  Negative for chest pain, palpitations and leg swelling.  Gastrointestinal:  Negative for abdominal distention, abdominal pain, blood in stool, constipation, diarrhea, nausea and vomiting.  Endocrine: Negative for cold intolerance, heat intolerance, polydipsia, polyphagia and polyuria.  Genitourinary:  Negative for difficulty urinating, dysuria, flank pain, frequency and urgency.  Musculoskeletal:  Negative for arthralgias, back pain, gait problem, joint swelling, myalgias, neck pain and neck stiffness.  Skin:  Negative for color change, pallor, rash and wound.  Neurological:  Negative for dizziness, syncope, speech difficulty, weakness, light-headedness, numbness and headaches.  Hematological:  Does not bruise/bleed easily.  Psychiatric/Behavioral:  Negative for agitation, behavioral problems, confusion, hallucinations, self-injury, sleep disturbance and suicidal ideas. The patient is not nervous/anxious.     Immunization History  Administered Date(s) Administered   Influenza,inj,Quad PF,6+ Mos 08/24/2014   Pneumococcal Polysaccharide-23 08/17/2019   Pertinent  Health Maintenance Due  Topic Date Due   OPHTHALMOLOGY EXAM  Never done   PAP SMEAR-Modifier  Never done   COLONOSCOPY (Pts 45-33yr Insurance coverage will need to be confirmed)  Never done   INFLUENZA VACCINE  03/01/2023 (Originally 07/01/2022)   HEMOGLOBIN A1C  01/29/2023   FOOT EXAM  07/30/2023      10/28/2021     1:11 PM 11/12/2021    2:24 PM 02/11/2022    2:26 PM 03/24/2022    2:54 PM 07/29/2022   10:03 AM  Fall Risk  Falls in the past year?  0 0 0 0  (RETIRED) Patient Fall Risk Level Low fall risk Low fall risk Low fall risk Low fall risk Low fall risk   Functional Status Survey:    Vitals:   01/27/23 0849  BP: 138/80  Pulse: (!) 53  Resp: 20  Temp: 98 F (36.7 C)  SpO2: 98%  Weight: 186 lb 9.6 oz (84.6 kg)  Height: 5' (1.524 m)   Body mass index is 36.44 kg/m. Physical Exam Vitals reviewed.  Constitutional:      General: She is not in acute distress.    Appearance: Normal appearance. She is obese. She is not ill-appearing or diaphoretic.  HENT:     Head: Normocephalic.     Right Ear: Tympanic membrane, ear canal and external ear normal. There is no impacted cerumen.     Left Ear: Tympanic membrane, ear canal and external ear normal. There is no impacted cerumen.     Nose: Nose normal. No congestion or rhinorrhea.     Mouth/Throat:     Mouth: Mucous membranes are  moist.     Pharynx: Oropharynx is clear. No oropharyngeal exudate or posterior oropharyngeal erythema.  Eyes:     General: No scleral icterus.       Right eye: No discharge.        Left eye: No discharge.     Extraocular Movements: Extraocular movements intact.     Conjunctiva/sclera: Conjunctivae normal.     Pupils: Pupils are equal, round, and reactive to light.  Neck:     Vascular: No carotid bruit.  Cardiovascular:     Rate and Rhythm: Normal rate and regular rhythm.     Pulses: Normal pulses.     Heart sounds: Normal heart sounds. No murmur heard.    No friction rub. No gallop.  Pulmonary:     Effort: Pulmonary effort is normal. No respiratory distress.     Breath sounds: Normal breath sounds. No wheezing, rhonchi or rales.  Chest:     Chest wall: No tenderness.  Abdominal:     General: Bowel sounds are normal. There is no distension.     Palpations: Abdomen is soft. There is no mass.      Tenderness: There is no abdominal tenderness. There is no right CVA tenderness, left CVA tenderness, guarding or rebound.  Musculoskeletal:        General: No swelling or tenderness. Normal range of motion.     Cervical back: Normal range of motion. No rigidity or tenderness.     Right lower leg: No edema.     Left lower leg: No edema.  Lymphadenopathy:     Cervical: No cervical adenopathy.  Skin:    General: Skin is warm and dry.     Coloration: Skin is not pale.     Findings: No bruising, erythema, lesion or rash.  Neurological:     Mental Status: She is alert and oriented to person, place, and time.     Cranial Nerves: No cranial nerve deficit.     Sensory: No sensory deficit.     Motor: No weakness.     Coordination: Coordination normal.     Gait: Gait normal.  Psychiatric:        Mood and Affect: Mood normal.        Speech: Speech normal.        Behavior: Behavior normal.        Thought Content: Thought content normal.        Judgment: Judgment normal.    Labs reviewed: Recent Labs    07/29/22 1038  NA 138  K 4.4  CL 102  CO2 25  GLUCOSE 240*  BUN 8  CREATININE 0.74  CALCIUM 10.8*   Recent Labs    07/29/22 1038  AST 21  ALT 18  ALKPHOS 104  BILITOT 0.3  PROT 7.6  ALBUMIN 4.5   Recent Labs    07/29/22 1038  WBC 2.8*  NEUTROABS 1.1*  HGB 14.2  HCT 42.5  MCV 87  PLT 172   Lab Results  Component Value Date   TSH 2.454 04/26/2020   Lab Results  Component Value Date   HGBA1C 9.2 (A) 07/29/2022   Lab Results  Component Value Date   CHOL 164 07/29/2022   HDL 44 07/29/2022   LDLCALC 104 (H) 07/29/2022   TRIG 82 07/29/2022   CHOLHDL 3.7 07/29/2022    Significant Diagnostic Results in last 30 days:  No results found.  Assessment/Plan 1. Bronchospasm No wheezing on exam  Will refill albuterol to use as needed  -  albuterol (VENTOLIN HFA) 108 (90 Base) MCG/ACT inhaler; Inhale 2 puffs into the lungs every 6 (six) hours as needed for wheezing  or shortness of breath.  Dispense: 18 g; Refill: 2  2. Encounter to establish care Available records reviewed,immunization up to date except due for Tdap and COVID-19 vaccine.aware to get COVID-19 vaccine at the pharmacy.Recommended to schedule for fasting labs and pap smear.   3. Type 2 diabetes mellitus with hyperlipidemia ([place]) Lab Results  Component Value Date   HGBA1C 9.2 (A) 07/29/2022  No home CBG for review.advised to check CBG at least once daily  - continue on Jardiance,sitagliptin- metformin  and glimepiride  - continue on Lisinopril for renal protection  - continue on atorvastatin. Recommend starting on ASA on next visit.  - Microalbumin / creatinine urine ratio - TSH - COMPLETE METABOLIC PANEL WITH GFR - CBC with Differential/Platelet - Ambulatory referral to Ophthalmology - Hemoglobin A1c  4. Hyperlipidemia LDL goal <100 - continue on atorvastatin  - dietary modification and exercise at least 3 times per week for 30 minutes  - Lipid panel  5. Colon cancer screening Asymptomatic  - Ambulatory referral to Gastroenterology  6. Encounter for hepatitis C screening test for low risk patient Low risk  - Hepatitis C antibody  7. Essential hypertension B/p well controlled  - continue on Lisinopril    8. Body mass index (BMI) of 36.0-36.9 in adult BMI 36.44  Dietary and lifestyle modification advised.   9. Class 2 severe obesity due to excess calories with serious comorbidity and body mass index (BMI) of 36.0 to 36.9 in adult (HCC) BMI 36.44 with associated serious comorbidity such as type 2 DM,hyperlipidemia and Hypertension. - dietary modification and exercise at least 3 times per week for 30 minutes advised.  Family/ staff Communication: Reviewed plan of care with patient, patient's husband and daughter verbalized understanding.  Labs/tests ordered:  - CBC with Differential/Platelet - CMP with eGFR([facility]) - TSH - Hgb A1C - Lipid panel - Hep C  Antibody  Next Appointment : Return in about 4 months (around 05/28/2023) for medical mangement of chronic issues.Pap smear in one week .   Sandrea Hughs, NP

## 2023-01-28 LAB — CBC WITH DIFFERENTIAL/PLATELET
Absolute Monocytes: 403 cells/uL (ref 200–950)
Basophils Absolute: 10 cells/uL (ref 0–200)
Basophils Relative: 0.3 %
Eosinophils Absolute: 50 cells/uL (ref 15–500)
Eosinophils Relative: 1.5 %
HCT: 39.2 % (ref 35.0–45.0)
Hemoglobin: 13.3 g/dL (ref 11.7–15.5)
Lymphs Abs: 1333 cells/uL (ref 850–3900)
MCH: 29.8 pg (ref 27.0–33.0)
MCHC: 33.9 g/dL (ref 32.0–36.0)
MCV: 87.9 fL (ref 80.0–100.0)
MPV: 11.9 fL (ref 7.5–12.5)
Monocytes Relative: 12.2 %
Neutro Abs: 1505 cells/uL (ref 1500–7800)
Neutrophils Relative %: 45.6 %
Platelets: 213 10*3/uL (ref 140–400)
RBC: 4.46 10*6/uL (ref 3.80–5.10)
RDW: 13.2 % (ref 11.0–15.0)
Total Lymphocyte: 40.4 %
WBC: 3.3 10*3/uL — ABNORMAL LOW (ref 3.8–10.8)

## 2023-01-28 LAB — HEPATITIS C ANTIBODY: Hepatitis C Ab: NONREACTIVE

## 2023-01-28 LAB — COMPLETE METABOLIC PANEL WITH GFR
AG Ratio: 1.3 (calc) (ref 1.0–2.5)
ALT: 13 U/L (ref 6–29)
AST: 14 U/L (ref 10–35)
Albumin: 4 g/dL (ref 3.6–5.1)
Alkaline phosphatase (APISO): 89 U/L (ref 31–125)
BUN: 10 mg/dL (ref 7–25)
CO2: 26 mmol/L (ref 20–32)
Calcium: 10.3 mg/dL — ABNORMAL HIGH (ref 8.6–10.2)
Chloride: 105 mmol/L (ref 98–110)
Creat: 0.8 mg/dL (ref 0.50–0.99)
Globulin: 3 g/dL (calc) (ref 1.9–3.7)
Glucose, Bld: 163 mg/dL — ABNORMAL HIGH (ref 65–99)
Potassium: 4.5 mmol/L (ref 3.5–5.3)
Sodium: 136 mmol/L (ref 135–146)
Total Bilirubin: 0.3 mg/dL (ref 0.2–1.2)
Total Protein: 7 g/dL (ref 6.1–8.1)
eGFR: 92 mL/min/{1.73_m2} (ref >=60)

## 2023-01-28 LAB — MICROALBUMIN / CREATININE URINE RATIO
Creatinine, Urine: 75 mg/dL (ref 20–275)
Microalb Creat Ratio: 3 mcg/mg creat (ref <30)
Microalb, Ur: 0.2 mg/dL

## 2023-01-28 LAB — TSH: TSH: 2.67 mIU/L

## 2023-01-28 LAB — LIPID PANEL
Cholesterol: 126 mg/dL (ref <200)
HDL: 49 mg/dL — ABNORMAL LOW (ref >=50)
LDL Cholesterol (Calc): 65 mg/dL (calc)
Non-HDL Cholesterol (Calc): 77 mg/dL (calc) (ref <130)
Total CHOL/HDL Ratio: 2.6 (calc) (ref <5.0)
Triglycerides: 50 mg/dL (ref <150)

## 2023-01-28 LAB — HEMOGLOBIN A1C
Hgb A1c MFr Bld: 10.3 % of total Hgb — ABNORMAL HIGH (ref <5.7)
Mean Plasma Glucose: 249 mg/dL
eAG (mmol/L): 13.8 mmol/L

## 2023-02-04 ENCOUNTER — Encounter: Payer: Medicaid Other | Admitting: Family

## 2023-02-04 NOTE — Progress Notes (Signed)
  This encounter was created in error - please disregard. No show 

## 2023-02-20 ENCOUNTER — Telehealth: Payer: Self-pay | Admitting: Family

## 2023-02-20 NOTE — Telephone Encounter (Signed)
Sarah Owens with [facility] called in to say that Dr Gershon Crane is not taking any new patients at this moment so referral sent was not accepted. Please advise

## 2023-03-03 ENCOUNTER — Encounter: Payer: Medicaid Other | Admitting: Family

## 2023-03-04 ENCOUNTER — Ambulatory Visit (INDEPENDENT_AMBULATORY_CARE_PROVIDER_SITE_OTHER): Payer: Medicaid Other | Admitting: Family

## 2023-03-04 ENCOUNTER — Encounter: Payer: Self-pay | Admitting: Family

## 2023-03-04 VITALS — BP 130/82 | HR 60 | Temp 97.7°F | Resp 17 | Ht 60.0 in | Wt 184.8 lb

## 2023-03-04 DIAGNOSIS — J9801 Acute bronchospasm: Secondary | ICD-10-CM

## 2023-03-04 DIAGNOSIS — R1011 Right upper quadrant pain: Secondary | ICD-10-CM

## 2023-03-04 DIAGNOSIS — E1169 Type 2 diabetes mellitus with other specified complication: Secondary | ICD-10-CM | POA: Diagnosis not present

## 2023-03-04 DIAGNOSIS — R350 Frequency of micturition: Secondary | ICD-10-CM

## 2023-03-04 DIAGNOSIS — E785 Hyperlipidemia, unspecified: Secondary | ICD-10-CM

## 2023-03-04 LAB — POCT URINALYSIS DIP (MANUAL ENTRY)
Bilirubin, UA: NEGATIVE
Blood, UA: NEGATIVE
Glucose, UA: >=1000 mg/dL — AB
Ketones, POC UA: NEGATIVE mg/dL
Nitrite, UA: NEGATIVE
Protein Ur, POC: NEGATIVE mg/dL
Spec Grav, UA: 1.015 (ref 1.010–1.025)
Urobilinogen, UA: 0.2 E.U./dL
pH, UA: 6.5 (ref 5.0–8.0)

## 2023-03-04 LAB — GLUCOSE, POCT (MANUAL RESULT ENTRY): POC Glucose: 141 mg/dl — AB (ref 70–99)

## 2023-03-04 MED ORDER — ALBUTEROL SULFATE HFA 108 (90 BASE) MCG/ACT IN AERS: 2.0000 | INHALATION_SPRAY | Freq: Four times a day (QID) | RESPIRATORY_TRACT | 2 refills | Status: DC | PRN

## 2023-03-04 MED ORDER — GLIMEPIRIDE 2 MG PO TABS: 2.0000 mg | ORAL_TABLET | Freq: Every day | ORAL | 1 refills | Status: DC

## 2023-03-04 NOTE — Progress Notes (Signed)
Provider: Richarda Blade FNP-C  Redith Drach, Donalee Citrin, NP  Patient Care Team: Abbagayle Zaragoza, Donalee Citrin, NP as PCP - General (Family Medicine)  Extended Emergency Contact Information Primary Emergency Contact: Pampo,Joseph Address: [address]          [place], [place] [address] [place] Home Phone: [phone] Relation: Spouse Secondary Emergency Contact: Franki Monte, [place] Home Phone: [phone] Relation: Relative  Code Status:  Full Code  Goals of care: Advanced Directive information    03/04/2023    9:35 AM  Advanced Directives  Does Patient Have a Medical Advance Directive? Yes     Chief Complaint  Patient presents with   Acute Visit    Patient states she has been having stomach pain and trouble standing at work    HPI:  Pt is a 47 y.o. female seen today for an acute visit for follow up lab results.Had labs done 01/27/2023 but missed her follow up appointment. Her recent lab work reviewed and discussed during the visit. Hgb A1C 10.3  glucose 163 previous 9.2 states home CBG runs in the 80's sometimes feels hungry and shaky.symptoms usually resolve after eating.  Calcium 10.3 previous 10.8  WBC 3.3  She complains of right upper abdominal pain x 2 weeks radiating to the upper back. She denies any nausea or vomiting.Also denies any fever or chills.Has had urine frequency.   Past Medical History:  Diagnosis Date   Diabetes mellitus without complication    Hyperlipidemia    Hypertension    No pertinent past medical history    Past Surgical History:  Procedure Laterality Date   CESAREAN SECTION N/A 08/22/2014   Procedure: CESAREAN SECTION;  Surgeon: Philip Aspen, DO;  Location: [facility];  Service: Obstetrics;  Laterality: N/A;, for breech presentation.   TUBAL LIGATION Bilateral 08/22/2014   Procedure: BILATERAL TUBAL LIGATION;  Surgeon: Philip Aspen, DO;  Location: [facility];  Service: Obstetrics;  Laterality:  Bilateral;   WISDOM TOOTH EXTRACTION      No Known Allergies  Outpatient Encounter Medications as of 03/04/2023  Medication Sig   Accu-Chek Softclix Lancets lancets Check blood glucose twice daily before meals.   acetaminophen (TYLENOL) 500 MG tablet Take 500 mg by mouth every 6 (six) hours as needed for mild pain or headache.   albuterol (VENTOLIN HFA) 108 (90 Base) MCG/ACT inhaler Inhale 2 puffs into the lungs every 6 (six) hours as needed for wheezing or shortness of breath.   atorvastatin (LIPITOR) 10 MG tablet Take 1 tablet (10 mg total) by mouth daily.   blood glucose meter kit and supplies Dispense based on patient and insurance preference. Use up to four times daily as directed. (FOR ICD-10 E10.9, E11.9).   Blood Glucose Monitoring Suppl (ACCU-CHEK AVIVA PLUS) w/Device KIT Check blood glucose twice daily before meals   empagliflozin (JARDIANCE) 25 MG TABS tablet Take 1 tablet (25 mg total) by mouth daily before breakfast.   glimepiride (AMARYL) 4 MG tablet Take 1 tablet (4 mg total) by mouth daily before breakfast.   glucose blood (ACCU-CHEK AVIVA PLUS) test strip Check blood glucose twice daily before meals   lisinopril (ZESTRIL) 20 MG tablet Take 1 tablet (20 mg total) by mouth daily.   loratadine (CLARITIN) 10 MG tablet Take 1 tablet (10 mg total) by mouth daily.   sitaGLIPtin-metformin (JANUMET) 50-1000 MG tablet Take 1 tablet by mouth 2 (two) times daily with a meal.  No facility-administered encounter medications on file as of 03/04/2023.    Review of Systems  Constitutional:  Negative for appetite change, chills, fatigue, fever and unexpected weight change.  HENT:  Negative for congestion, dental problem, ear discharge, ear pain, facial swelling, hearing loss, nosebleeds, postnasal drip, rhinorrhea, sinus pressure, sinus pain, sneezing, sore throat, tinnitus and trouble swallowing.   Eyes:  Negative for pain, discharge, redness, itching and visual disturbance.  Respiratory:   Negative for cough, chest tightness, shortness of breath and wheezing.   Cardiovascular:  Negative for chest pain, palpitations and leg swelling.  Gastrointestinal:  Positive for abdominal pain. Negative for abdominal distention, constipation, diarrhea, nausea and vomiting.  Endocrine: Negative for cold intolerance, heat intolerance, polydipsia, polyphagia and polyuria.  Genitourinary:  Negative for difficulty urinating, dysuria, flank pain, frequency and urgency.  Musculoskeletal:  Negative for arthralgias, back pain, gait problem, joint swelling, myalgias, neck pain and neck stiffness.  Skin:  Negative for color change, pallor, rash and wound.  Neurological:  Negative for dizziness, syncope, speech difficulty, weakness, light-headedness, numbness and headaches.  Hematological:  Does not bruise/bleed easily.  Psychiatric/Behavioral:  Negative for agitation, behavioral problems, confusion, hallucinations and sleep disturbance. The patient is not nervous/anxious.     Immunization History  Administered Date(s) Administered   Influenza,inj,Quad PF,6+ Mos 08/24/2014   Pneumococcal Polysaccharide-23 08/17/2019   Td 01/27/2023   Pertinent  Health Maintenance Due  Topic Date Due   OPHTHALMOLOGY EXAM  Never done   PAP SMEAR-Modifier  Never done   COLONOSCOPY (Pts 45-1yrs Insurance coverage will need to be confirmed)  Never done   INFLUENZA VACCINE  07/02/2023   HEMOGLOBIN A1C  07/28/2023   FOOT EXAM  07/30/2023      11/12/2021    2:24 PM 02/11/2022    2:26 PM 03/24/2022    2:54 PM 07/29/2022   10:03 AM 03/04/2023    9:34 AM  Fall Risk  Falls in the past year? 0 0 0 0 0  Was there an injury with Fall?     0  Fall Risk Category Calculator     0  (RETIRED) Patient Fall Risk Level Low fall risk Low fall risk Low fall risk Low fall risk   Patient at Risk for Falls Due to     No Fall Risks  Fall risk Follow up     Falls evaluation completed   Functional Status Survey:    Vitals:    03/04/23 0933  BP: 130/82  Pulse: 60  Resp: 17  Temp: 97.7 F (36.5 C)  TempSrc: Temporal  SpO2: 96%  Weight: 184 lb 12.8 oz (83.8 kg)  Height: 5' (1.524 m)   Body mass index is 36.09 kg/m. Physical Exam Vitals reviewed.  Constitutional:      General: She is not in acute distress.    Appearance: Normal appearance. She is obese. She is not ill-appearing or diaphoretic.  HENT:     Head: Normocephalic.     Nose: Nose normal. No congestion or rhinorrhea.     Mouth/Throat:     Mouth: Mucous membranes are moist.     Pharynx: Oropharynx is clear. No oropharyngeal exudate or posterior oropharyngeal erythema.  Eyes:     General: No scleral icterus.       Right eye: No discharge.        Left eye: No discharge.     Extraocular Movements: Extraocular movements intact.     Conjunctiva/sclera: Conjunctivae normal.     Pupils: Pupils are equal, round,  and reactive to light.  Neck:     Vascular: No carotid bruit.  Cardiovascular:     Rate and Rhythm: Normal rate and regular rhythm.     Pulses: Normal pulses.     Heart sounds: Normal heart sounds. No murmur heard.    No friction rub. No gallop.  Pulmonary:     Effort: Pulmonary effort is normal. No respiratory distress.     Breath sounds: Normal breath sounds. No wheezing, rhonchi or rales.  Chest:     Chest wall: No tenderness.  Abdominal:     General: Bowel sounds are normal. There is no distension.     Palpations: Abdomen is soft. There is no mass.     Tenderness: There is abdominal tenderness in the right upper quadrant and right lower quadrant. There is no right CVA tenderness, left CVA tenderness, guarding or rebound. Negative signs include Murphy's sign and McBurney's sign.  Musculoskeletal:        General: No swelling or tenderness. Normal range of motion.     Cervical back: Normal range of motion. No rigidity or tenderness.     Right lower leg: No edema.     Left lower leg: No edema.  Lymphadenopathy:     Cervical: No  cervical adenopathy.  Skin:    General: Skin is warm and dry.     Coloration: Skin is not pale.     Findings: No bruising, erythema, lesion or rash.  Neurological:     Mental Status: She is alert and oriented to person, place, and time.     Cranial Nerves: No cranial nerve deficit.     Sensory: No sensory deficit.     Motor: No weakness.     Gait: Gait normal.  Psychiatric:        Mood and Affect: Mood normal.        Speech: Speech normal.        Behavior: Behavior normal.     Labs reviewed: Recent Labs    07/29/22 1038 01/27/23 0000  NA 138 136  K 4.4 4.5  CL 102 105  CO2 25 26  GLUCOSE 240* 163*  BUN 8 10  CREATININE 0.74 0.80  CALCIUM 10.8* 10.3*   Recent Labs    07/29/22 1038 01/27/23 0000  AST 21 14  ALT 18 13  ALKPHOS 104  --   BILITOT 0.3 0.3  PROT 7.6 7.0  ALBUMIN 4.5  --    Recent Labs    07/29/22 1038 01/27/23 0000  WBC 2.8* 3.3*  NEUTROABS 1.1* 1,505  HGB 14.2 13.3  HCT 42.5 39.2  MCV 87 87.9  PLT 172 213   Lab Results  Component Value Date   TSH 2.67 01/27/2023   Lab Results  Component Value Date   HGBA1C 10.3 (H) 01/27/2023   Lab Results  Component Value Date   CHOL 126 01/27/2023   HDL 49 (L) 01/27/2023   LDLCALC 65 01/27/2023   TRIG 50 01/27/2023   CHOLHDL 2.6 01/27/2023    Significant Diagnostic Results in last 30 days:  No results found.  Assessment/Plan 1. Bronchospasm Continue albuterol - albuterol (VENTOLIN HFA) 108 (90 Base) MCG/ACT inhaler; Inhale 2 puffs into the lungs every 6 (six) hours as needed for wheezing or shortness of breath.  Dispense: 18 g; Refill: 2  2. Type 2 diabetes mellitus with hyperlipidemia Lab Results  Component Value Date   HGBA1C 10.3 (H) 01/27/2023  Had been out of her medication.Medications were refilled last visit.  CBGs at home has been running in the 80s with symptoms of hypoglycemia reported.  Will decrease glimepiride from 4 mg to 2 mg tablet daily. -Continue on Janumet and  Jardiance -Continue to monitor CBG at home notify provider for any readings in the below 75 or greater than 150 - glimepiride (AMARYL) 2 MG tablet; Take 1 tablet (2 mg total) by mouth daily before breakfast.  Dispense: 90 tablet; Refill: 1 - TSH; Future - COMPLETE METABOLIC PANEL WITH GFR; Future - CBC with Differential/Platelet; Future - Hemoglobin A1c; Future - Lipid Panel; Future - POCT glucose (manual entry)  3. Right upper quadrant abdominal pain Afebrile Tender to palpation on right upper and lower quadrant Recent lab work unremarkable.Will obtain abdominal ultrasound to rule out other acute abnormalities. - US Abdomen Limited RUQ (LIVER/GB); Future  4. Frequency of urination Afebrile  - POCT urinalysis dipstick indicates the yellow clear urine negative for blood, protein and nitrites but positive for trace leukocytes -Encouraged to increase water intake to 6 to 8 glasses daily -Will send urine for culture as then will treat with antibiotics if indicated. - Urine Culture  Family/ staff Communication: Reviewed plan of care with patient and daughter verbalized understanding  Labs/tests ordered:  - Urine Culture - TSH; Future - COMPLETE METABOLIC PANEL WITH GFR; Future - CBC with Differential/Platelet; Future - Hemoglobin A1c; Future - Lipid Panel; Future - POCT glucose (manual entry) - US Abdomen Limited RUQ (LIVER/GB); Future  Next Appointment: Return in about 4 months (around 07/04/2023) for medical mangement of chronic issues., fasting labs prior to visit.Pap smear soon 1 month  .   Caesar Bookman, NP

## 2023-03-05 LAB — URINE CULTURE
MICRO NUMBER:: 14778538
SPECIMEN QUALITY:: ADEQUATE

## 2023-03-15 NOTE — Progress Notes (Signed)
  This encounter was created in error - please disregard. No show 

## 2023-04-01 ENCOUNTER — Encounter: Payer: Self-pay | Admitting: Family

## 2023-04-01 ENCOUNTER — Ambulatory Visit (INDEPENDENT_AMBULATORY_CARE_PROVIDER_SITE_OTHER): Payer: Medicaid Other | Admitting: Family

## 2023-04-01 VITALS — BP 122/92 | HR 58 | Temp 97.5°F | Ht 60.0 in | Wt 184.0 lb

## 2023-04-01 DIAGNOSIS — Z124 Encounter for screening for malignant neoplasm of cervix: Secondary | ICD-10-CM

## 2023-04-01 NOTE — Progress Notes (Signed)
Provider: Richarda Blade FNP-C  Geanine Vandekamp, Donalee Citrin, NP  Patient Care Team: Jerrin Recore, Donalee Citrin, NP as PCP - General (Family Medicine)  Extended Emergency Contact Information Primary Emergency Contact: Pampo,Joseph Address: [address]          [place], [place] [address] [place] Home Phone: [phone] Relation: Spouse Secondary Emergency Contact: Franki Monte, [place] Home Phone: [phone] Relation: Relative  Code Status:  Full Code  Goals of care: Advanced Directive information    03/04/2023    9:35 AM  Advanced Directives  Does Patient Have a Medical Advance Directive? Yes     Chief Complaint  Patient presents with   Follow-up    HPI:  Pt is a 47 y.o. female seen today for an acute visit for pap smear.she is here with daughter.she denies any acute issues. States does not do own breast exam.Denies any breast tenderness,pain,swelling or discharge.    Past Medical History:  Diagnosis Date   Diabetes mellitus without complication (HCC)    Hyperlipidemia    Hypertension    No pertinent past medical history    Past Surgical History:  Procedure Laterality Date   CESAREAN SECTION N/A 08/22/2014   Procedure: CESAREAN SECTION;  Surgeon: Philip Aspen, DO;  Location: [facility];  Service: Obstetrics;  Laterality: N/A;, for breech presentation.   TUBAL LIGATION Bilateral 08/22/2014   Procedure: BILATERAL TUBAL LIGATION;  Surgeon: Philip Aspen, DO;  Location: [facility];  Service: Obstetrics;  Laterality: Bilateral;   WISDOM TOOTH EXTRACTION      No Known Allergies  Outpatient Encounter Medications as of 04/01/2023  Medication Sig   Accu-Chek Softclix Lancets lancets Check blood glucose twice daily before meals.   acetaminophen (TYLENOL) 500 MG tablet Take 500 mg by mouth every 6 (six) hours as needed for mild pain or headache.   albuterol (VENTOLIN HFA) 108 (90 Base) MCG/ACT inhaler Inhale 2 puffs into the lungs  every 6 (six) hours as needed for wheezing or shortness of breath.   atorvastatin (LIPITOR) 10 MG tablet Take 1 tablet (10 mg total) by mouth daily.   blood glucose meter kit and supplies Dispense based on patient and insurance preference. Use up to four times daily as directed. (FOR ICD-10 E10.9, E11.9).   Blood Glucose Monitoring Suppl (ACCU-CHEK AVIVA PLUS) w/Device KIT Check blood glucose twice daily before meals   empagliflozin (JARDIANCE) 25 MG TABS tablet Take 1 tablet (25 mg total) by mouth daily before breakfast.   glimepiride (AMARYL) 2 MG tablet Take 1 tablet (2 mg total) by mouth daily before breakfast.   glucose blood (ACCU-CHEK AVIVA PLUS) test strip Check blood glucose twice daily before meals   lisinopril (ZESTRIL) 20 MG tablet Take 1 tablet (20 mg total) by mouth daily.   loratadine (CLARITIN) 10 MG tablet Take 1 tablet (10 mg total) by mouth daily.   sitaGLIPtin-metformin (JANUMET) 50-1000 MG tablet Take 1 tablet by mouth 2 (two) times daily with a meal.   No facility-administered encounter medications on file as of 04/01/2023.    Review of Systems  Constitutional:  Negative for appetite change, chills, fatigue, fever and unexpected weight change.  Respiratory:  Negative for cough, chest tightness, shortness of breath and wheezing.   Cardiovascular:  Negative for chest pain, palpitations and leg swelling.  Gastrointestinal:  Negative for abdominal distention, abdominal pain, blood in stool, constipation, diarrhea, nausea and vomiting.  Genitourinary:  Negative for  difficulty urinating, dysuria, flank pain, frequency, hematuria, pelvic pain, urgency, vaginal bleeding, vaginal discharge and vaginal pain.  Skin:  Negative for color change, pallor, rash and wound.    Immunization History  Administered Date(s) Administered   Influenza,inj,Quad PF,6+ Mos 08/24/2014   Pneumococcal Polysaccharide-23 08/17/2019   Td 01/27/2023   Pertinent  Health Maintenance Due  Topic Date Due    OPHTHALMOLOGY EXAM  Never done   PAP SMEAR-Modifier  Never done   COLONOSCOPY (Pts 45-75yrs Insurance coverage will need to be confirmed)  Never done   INFLUENZA VACCINE  07/02/2023   HEMOGLOBIN A1C  07/28/2023   FOOT EXAM  07/30/2023      11/12/2021    2:24 PM 02/11/2022    2:26 PM 03/24/2022    2:54 PM 07/29/2022   10:03 AM 03/04/2023    9:34 AM  Fall Risk  Falls in the past year? 0 0 0 0 0  Was there an injury with Fall?     0  Fall Risk Category Calculator     0  (RETIRED) Patient Fall Risk Level Low fall risk Low fall risk Low fall risk Low fall risk   Patient at Risk for Falls Due to     No Fall Risks  Fall risk Follow up     Falls evaluation completed   Functional Status Survey:    Vitals:   04/01/23 1010  BP: (!) 122/92  Pulse: (!) 58  Temp: (!) 97.5 F (36.4 C)  TempSrc: Temporal  SpO2: 98%  Weight: 184 lb (83.5 kg)  Height: 5' (1.524 m)   Body mass index is 35.94 kg/m. Physical Exam Vitals reviewed. Exam conducted with a chaperone present [place]  M. Pass ,CMA).  Constitutional:      General: She is not in acute distress.    Appearance: Normal appearance. She is obese. She is not ill-appearing or diaphoretic.  Eyes:     General: No scleral icterus.       Right eye: No discharge.        Left eye: No discharge.     Conjunctiva/sclera: Conjunctivae normal.     Pupils: Pupils are equal, round, and reactive to light.  Cardiovascular:     Rate and Rhythm: Normal rate and regular rhythm.     Pulses: Normal pulses.     Heart sounds: Normal heart sounds. No murmur heard.    No friction rub. No gallop.  Pulmonary:     Effort: Pulmonary effort is normal. No respiratory distress.     Breath sounds: Normal breath sounds. No wheezing, rhonchi or rales.  Chest:     Chest wall: No tenderness.  Breasts:    Right: Normal.     Left: Normal.  Abdominal:     General: Bowel sounds are normal. There is no distension.     Palpations: Abdomen is soft. There is no  mass.     Tenderness: There is no abdominal tenderness. There is no right CVA tenderness, left CVA tenderness, guarding or rebound.     Hernia: There is no hernia in the left inguinal area or right inguinal area.  Genitourinary:    Exam position: Lithotomy position.     Labia:        Right: No rash, tenderness, lesion or injury.        Left: No rash, tenderness, lesion or injury.      Urethra: No prolapse, urethral pain, urethral swelling or urethral lesion.     Vagina: Vaginal discharge present.  No erythema, tenderness, bleeding, lesions or prolapsed vaginal walls.     Cervix: Discharge present. No friability, lesion, erythema or cervical bleeding.     Uterus: Normal.      Adnexa: Right adnexa normal and left adnexa normal.  Musculoskeletal:        General: No swelling or tenderness. Normal range of motion.     Right lower leg: No edema.     Left lower leg: No edema.  Lymphadenopathy:     Upper Body:     Right upper body: No supraclavicular, axillary or pectoral adenopathy.     Left upper body: No supraclavicular, axillary or pectoral adenopathy.     Lower Body: No right inguinal adenopathy. No left inguinal adenopathy.  Skin:    General: Skin is warm and dry.     Coloration: Skin is not pale.     Findings: No bruising, erythema, lesion or rash.  Neurological:     Mental Status: She is alert and oriented to person, place, and time.     Motor: No weakness.     Gait: Gait normal.  Psychiatric:        Mood and Affect: Mood normal.        Speech: Speech normal.        Behavior: Behavior normal.    Labs reviewed: Recent Labs    07/29/22 1038 01/27/23 0000  NA 138 136  K 4.4 4.5  CL 102 105  CO2 25 26  GLUCOSE 240* 163*  BUN 8 10  CREATININE 0.74 0.80  CALCIUM 10.8* 10.3*   Recent Labs    07/29/22 1038 01/27/23 0000  AST 21 14  ALT 18 13  ALKPHOS 104  --   BILITOT 0.3 0.3  PROT 7.6 7.0  ALBUMIN 4.5  --    Recent Labs    07/29/22 1038 01/27/23 0000  WBC 2.8*  3.3*  NEUTROABS 1.1* 1,505  HGB 14.2 13.3  HCT 42.5 39.2  MCV 87 87.9  PLT 172 213   Lab Results  Component Value Date   TSH 2.67 01/27/2023   Lab Results  Component Value Date   HGBA1C 10.3 (H) 01/27/2023   Lab Results  Component Value Date   CHOL 126 01/27/2023   HDL 49 (L) 01/27/2023   LDLCALC 65 01/27/2023   TRIG 50 01/27/2023   CHOLHDL 2.6 01/27/2023    Significant Diagnostic Results in last 30 days:  No results found.  Assessment/Plan  Cervical cancer screening Afebrile  Pelvic exam done thick whitish discharge noted.No tenderness or bleeding noted. Tolerated procedure well  - PAP, Image Guided [[facility]]  Family/ staff Communication: Reviewed plan of care with patient verbalized understanding.   Labs/tests ordered:  - PAP, Image Guided [[facility]]  Next Appointment: Return if symptoms worsen or fail to improve.   Caesar Bookman, NP

## 2023-04-06 LAB — PAP IG (IMAGE GUIDED)

## 2023-05-19 ENCOUNTER — Encounter: Payer: Self-pay | Admitting: Family

## 2023-05-19 ENCOUNTER — Ambulatory Visit: Payer: Medicaid Other | Admitting: Family

## 2023-05-26 ENCOUNTER — Other Ambulatory Visit: Payer: Self-pay

## 2023-05-26 DIAGNOSIS — E1169 Type 2 diabetes mellitus with other specified complication: Secondary | ICD-10-CM

## 2023-06-30 ENCOUNTER — Other Ambulatory Visit: Payer: Medicaid Other

## 2023-07-03 ENCOUNTER — Ambulatory Visit (INDEPENDENT_AMBULATORY_CARE_PROVIDER_SITE_OTHER): Payer: Medicaid Other | Admitting: Family

## 2023-07-03 ENCOUNTER — Encounter: Payer: Self-pay | Admitting: Family

## 2023-07-03 VITALS — BP 130/82 | HR 59 | Temp 96.9°F | Resp 18 | Ht 60.0 in | Wt 176.0 lb

## 2023-07-03 DIAGNOSIS — E1169 Type 2 diabetes mellitus with other specified complication: Secondary | ICD-10-CM

## 2023-07-03 DIAGNOSIS — K029 Dental caries, unspecified: Secondary | ICD-10-CM

## 2023-07-03 DIAGNOSIS — Z1211 Encounter for screening for malignant neoplasm of colon: Secondary | ICD-10-CM

## 2023-07-03 DIAGNOSIS — E785 Hyperlipidemia, unspecified: Secondary | ICD-10-CM

## 2023-07-03 DIAGNOSIS — I10 Essential (primary) hypertension: Secondary | ICD-10-CM

## 2023-07-03 MED ORDER — SODIUM FLUORIDE 1.1 % DT CREA
1.0000 | TOPICAL_CREAM | Freq: Every evening | DENTAL | 1 refills | Status: DC
Start: 2023-07-03 — End: 2023-07-30

## 2023-07-03 NOTE — Progress Notes (Signed)
Provider: Richarda Blade FNP-C   Sadiel Mota, Donalee Citrin, NP  Patient Care Team: Meryl Ponder, Donalee Citrin, NP as PCP - General (Family Medicine) [facility], [facility]  Extended Emergency Contact Information Primary Emergency Contact: Pampo,Joseph Address: [address]          [place], [place] [address] [place] Home Phone: [phone] Relation: Spouse Secondary Emergency Contact: Franki Monte, [place] Home Phone: [phone] Relation: Relative  Code Status:  Full Code  Goals of care: Advanced Directive information    03/04/2023    9:35 AM  Advanced Directives  Does Patient Have a Medical Advance Directive? Yes     Chief Complaint  Patient presents with   Medical Management of Chronic Issues    Patient is here for a DM II F/U, HLD, HTN follow up   Quality Metric Gaps    Patient is due for colonoscopy, pap smear, eye exam, and covid and flu vaccine    HPI:  Pt is a 47 y.o. female seen today for 6 months follow up for medical management of chronic diseases. She is here with her daughter.  She complains of dental carries.states Husband was prescribed Denta 5000 plus at [facility]. she tried and has helped with her teeth too.she would like prescription.Emphasized on getting an appointment with the dentist due to brown decaying teeth.   Type DM - states CBG runs in the 100's she denies any hypoglycemia. She was referred to Ophthalmology in the previous visit. On chart review referral was sent to Dr.Shapiro but has retired.will reorder referral today.  She was also referred to GI for colonoscopy but according to chart  specialist office called and left voice message to patient but did not return call.Daughter would like her number to be called for GI referral.     Past Medical History:  Diagnosis Date   Diabetes mellitus without complication (HCC)    Hyperlipidemia    Hypertension    No pertinent past medical history    Past  Surgical History:  Procedure Laterality Date   CESAREAN SECTION N/A 08/22/2014   Procedure: CESAREAN SECTION;  Surgeon: Philip Aspen, DO;  Location: [facility];  Service: Obstetrics;  Laterality: N/A;, for breech presentation.   TUBAL LIGATION Bilateral 08/22/2014   Procedure: BILATERAL TUBAL LIGATION;  Surgeon: Philip Aspen, DO;  Location: [facility];  Service: Obstetrics;  Laterality: Bilateral;   WISDOM TOOTH EXTRACTION      No Known Allergies  Allergies as of 07/03/2023   No Known Allergies      Medication List        Accurate as of July 03, 2023 10:57 AM. If you have any questions, ask your nurse or doctor.          Accu-Chek Aviva Plus w/Device Kit Check blood glucose twice daily before meals   Accu-Chek Softclix Lancets lancets Check blood glucose twice daily before meals.   acetaminophen 500 MG tablet Commonly known as: TYLENOL Take 500 mg by mouth every 6 (six) hours as needed for mild pain or headache.   albuterol 108 (90 Base) MCG/ACT inhaler Commonly known as: VENTOLIN HFA Inhale 2 puffs into the lungs every 6 (six) hours as needed for wheezing or shortness of breath.   atorvastatin 10 MG tablet Commonly known as: LIPITOR Take 1 tablet (10 mg total) by mouth daily.   blood glucose meter kit and supplies Dispense based on patient and insurance  preference. Use up to four times daily as directed. (FOR ICD-10 E10.9, E11.9).   empagliflozin 25 MG Tabs tablet Commonly known as: Jardiance Take 1 tablet (25 mg total) by mouth daily before breakfast.   glimepiride 2 MG tablet Commonly known as: AMARYL Take 1 tablet (2 mg total) by mouth daily before breakfast.   glucose blood test strip Commonly known as: Accu-Chek Aviva Plus Check blood glucose twice daily before meals   Janumet 50-1000 MG tablet Generic drug: sitaGLIPtin-metformin Take 1 tablet by mouth 2 (two) times daily with a meal.   lisinopril 20 MG tablet Commonly known as: ZESTRIL Take 1 tablet  (20 mg total) by mouth daily.   loratadine 10 MG tablet Commonly known as: CLARITIN Take 1 tablet (10 mg total) by mouth daily.        Review of Systems  Constitutional:  Negative for appetite change, chills, fatigue, fever and unexpected weight change.  HENT:  Negative for congestion, dental problem, ear discharge, ear pain, facial swelling, hearing loss, nosebleeds, postnasal drip, rhinorrhea, sinus pressure, sinus pain, sneezing, sore throat, tinnitus and trouble swallowing.   Eyes:  Negative for pain, discharge, redness, itching and visual disturbance.  Respiratory:  Negative for cough, chest tightness, shortness of breath and wheezing.   Cardiovascular:  Negative for chest pain, palpitations and leg swelling.  Gastrointestinal:  Negative for abdominal distention, abdominal pain, blood in stool, constipation, diarrhea, nausea and vomiting.  Endocrine: Negative for cold intolerance, heat intolerance, polydipsia, polyphagia and polyuria.  Genitourinary:  Negative for difficulty urinating, dysuria, flank pain, frequency and urgency.  Musculoskeletal:  Negative for arthralgias, back pain, gait problem, joint swelling, myalgias, neck pain and neck stiffness.  Skin:  Negative for color change, pallor, rash and wound.  Neurological:  Negative for dizziness, syncope, speech difficulty, weakness, light-headedness, numbness and headaches.  Hematological:  Does not bruise/bleed easily.  Psychiatric/Behavioral:  Negative for agitation, behavioral problems, confusion, hallucinations, self-injury, sleep disturbance and suicidal ideas. The patient is not nervous/anxious.     Immunization History  Administered Date(s) Administered   Influenza,inj,Quad PF,6+ Mos 08/24/2014   Pneumococcal Polysaccharide-23 08/17/2019   Td 01/27/2023   Pertinent  Health Maintenance Due  Topic Date Due   OPHTHALMOLOGY EXAM  Never done   PAP SMEAR-Modifier  Never done   Colonoscopy  Never done   INFLUENZA VACCINE   07/02/2023   HEMOGLOBIN A1C  07/28/2023   FOOT EXAM  07/30/2023      11/12/2021    2:24 PM 02/11/2022    2:26 PM 03/24/2022    2:54 PM 07/29/2022   10:03 AM 03/04/2023    9:34 AM  Fall Risk  Falls in the past year? 0 0 0 0 0  Was there an injury with Fall?     0  Fall Risk Category Calculator     0  (RETIRED) Patient Fall Risk Level Low fall risk Low fall risk Low fall risk Low fall risk   Patient at Risk for Falls Due to     No Fall Risks  Fall risk Follow up     Falls evaluation completed   Functional Status Survey:    Vitals:   07/03/23 0855  BP: 130/82  Pulse: (!) 59  Resp: 18  Temp: (!) 96.9 F (36.1 C)  SpO2: 93%  Weight: 176 lb (79.8 kg)  Height: 5' (1.524 m)   Body mass index is 34.37 kg/m. Physical Exam Vitals reviewed.  Constitutional:      General: She is not in  acute distress.    Appearance: Normal appearance. She is normal weight. She is not ill-appearing or diaphoretic.  HENT:     Head: Normocephalic.     Right Ear: Tympanic membrane, ear canal and external ear normal. There is no impacted cerumen.     Left Ear: Tympanic membrane, ear canal and external ear normal. There is no impacted cerumen.     Nose: Nose normal. No congestion or rhinorrhea.     Mouth/Throat:     Mouth: Mucous membranes are moist.     Pharynx: Oropharynx is clear. No oropharyngeal exudate or posterior oropharyngeal erythema.  Eyes:     General: No scleral icterus.       Right eye: No discharge.        Left eye: No discharge.     Extraocular Movements: Extraocular movements intact.     Conjunctiva/sclera: Conjunctivae normal.     Pupils: Pupils are equal, round, and reactive to light.  Neck:     Vascular: No carotid bruit.  Cardiovascular:     Rate and Rhythm: Normal rate and regular rhythm.     Pulses: Normal pulses.     Heart sounds: Normal heart sounds. No murmur heard.    No friction rub. No gallop.  Pulmonary:     Effort: Pulmonary effort is normal. No respiratory  distress.     Breath sounds: Normal breath sounds. No wheezing, rhonchi or rales.  Chest:     Chest wall: No tenderness.  Abdominal:     General: Bowel sounds are normal. There is no distension.     Palpations: Abdomen is soft. There is no mass.     Tenderness: There is no abdominal tenderness. There is no right CVA tenderness, left CVA tenderness, guarding or rebound.  Musculoskeletal:        General: No swelling or tenderness. Normal range of motion.     Cervical back: Normal range of motion. No rigidity or tenderness.     Right lower leg: No edema.     Left lower leg: No edema.  Feet:     Right foot:     Skin integrity: Skin integrity normal.     Toenail Condition: Right toenails are long.     Left foot:     Skin integrity: Skin integrity normal.     Toenail Condition: Left toenails are long.  Lymphadenopathy:     Cervical: No cervical adenopathy.  Skin:    General: Skin is warm and dry.     Coloration: Skin is not pale.     Findings: No bruising, erythema, lesion or rash.  Neurological:     Mental Status: She is alert and oriented to person, place, and time.     Cranial Nerves: No cranial nerve deficit.     Sensory: No sensory deficit.     Motor: No weakness.     Coordination: Coordination normal.     Gait: Gait normal.  Psychiatric:        Mood and Affect: Mood normal.        Speech: Speech normal.        Behavior: Behavior normal.        Thought Content: Thought content normal.        Judgment: Judgment normal.     Labs reviewed: Recent Labs    07/29/22 1038 01/27/23 0000  NA 138 136  K 4.4 4.5  CL 102 105  CO2 25 26  GLUCOSE 240* 163*  BUN 8 10  CREATININE  0.74 0.80  CALCIUM 10.8* 10.3*   Recent Labs    07/29/22 1038 01/27/23 0000  AST 21 14  ALT 18 13  ALKPHOS 104  --   BILITOT 0.3 0.3  PROT 7.6 7.0  ALBUMIN 4.5  --    Recent Labs    07/29/22 1038 01/27/23 0000  WBC 2.8* 3.3*  NEUTROABS 1.1* 1,505  HGB 14.2 13.3  HCT 42.5 39.2  MCV 87  87.9  PLT 172 213   Lab Results  Component Value Date   TSH 2.67 01/27/2023   Lab Results  Component Value Date   HGBA1C 10.3 (H) 01/27/2023   Lab Results  Component Value Date   CHOL 126 01/27/2023   HDL 49 (L) 01/27/2023   LDLCALC 65 01/27/2023   TRIG 50 01/27/2023   CHOLHDL 2.6 01/27/2023    Significant Diagnostic Results in last 30 days:  No results found.  Assessment/Plan 1. Type 2 diabetes mellitus with hyperlipidemia (HCC) Lab Results  Component Value Date   HGBA1C 10.3 (H) 01/27/2023  Reports CBG in the 100's  - continue on Janumet ,Jardiance and Glimepiride  - declined podiatrist referral goes to salon or her daughter sometimes trims toenail - advised to follow up with Ophthalmology  - continue on ACE inhibitor for renal protection  - continue on statin  - Ambulatory referral to Ophthalmology  2. Hyperlipidemia LDL goal <100 LDL at goal  - continue atorvastatin - dietary modification and exercise advised    3. Essential hypertension B/p at goal  - continue on Lisinopril  - Advised to reduce salt intake in diet   4. Dental caries Several Brownish decaying teeth noted  - advised to schedule appointment with the dentist  - sodium fluoride (DENTA 5000 PLUS) 1.1 % CREA dental cream; Place 1 Application onto teeth every evening.  Dispense: 51 g; Refill: 1  5. Colon cancer screening Asymptomatic  - Ambulatory referral to Gastroenterology  Family/ staff Communication: Reviewed plan of care with patient and daughter verbalized understanding   Labs/tests ordered: Labs in place   Next Appointment : Return in about 6 months (around 01/03/2024) for medical mangement of chronic issues.Caesar Bookman, NP

## 2023-07-14 ENCOUNTER — Encounter: Payer: Medicaid Other | Admitting: Family

## 2023-07-14 NOTE — Progress Notes (Signed)
  This encounter was created in error - please disregard. No show 

## 2023-07-30 ENCOUNTER — Other Ambulatory Visit: Payer: Self-pay | Admitting: Family

## 2023-07-30 DIAGNOSIS — K029 Dental caries, unspecified: Secondary | ICD-10-CM

## 2023-09-07 ENCOUNTER — Encounter: Payer: Self-pay | Admitting: Family

## 2024-01-04 ENCOUNTER — Ambulatory Visit (INDEPENDENT_AMBULATORY_CARE_PROVIDER_SITE_OTHER): Payer: Medicaid Other | Admitting: Family

## 2024-01-04 ENCOUNTER — Encounter: Payer: Self-pay | Admitting: Family

## 2024-01-04 ENCOUNTER — Other Ambulatory Visit: Payer: Self-pay | Admitting: Family

## 2024-01-04 VITALS — BP 140/90 | HR 60 | Temp 98.0°F | Resp 20 | Ht 60.0 in | Wt 173.8 lb

## 2024-01-04 DIAGNOSIS — E119 Type 2 diabetes mellitus without complications: Secondary | ICD-10-CM

## 2024-01-04 DIAGNOSIS — E785 Hyperlipidemia, unspecified: Secondary | ICD-10-CM

## 2024-01-04 DIAGNOSIS — J9801 Acute bronchospasm: Secondary | ICD-10-CM

## 2024-01-04 DIAGNOSIS — Z1211 Encounter for screening for malignant neoplasm of colon: Secondary | ICD-10-CM

## 2024-01-04 DIAGNOSIS — I10 Essential (primary) hypertension: Secondary | ICD-10-CM

## 2024-01-04 DIAGNOSIS — E1169 Type 2 diabetes mellitus with other specified complication: Secondary | ICD-10-CM

## 2024-01-04 DIAGNOSIS — Z23 Encounter for immunization: Secondary | ICD-10-CM

## 2024-01-04 MED ORDER — JANUMET 50-1000 MG PO TABS: 1.0000 | ORAL_TABLET | Freq: Two times a day (BID) | ORAL | 1 refills | Status: DC

## 2024-01-04 MED ORDER — ACCU-CHEK SOFTCLIX LANCETS MISC
11 refills | Status: AC
Start: 2024-01-04 — End: ?

## 2024-01-04 MED ORDER — ATORVASTATIN CALCIUM 10 MG PO TABS: 10.0000 mg | ORAL_TABLET | Freq: Every day | ORAL | 3 refills | Status: DC

## 2024-01-04 MED ORDER — ACCU-CHEK AVIVA PLUS VI STRP: ORAL_STRIP | 12 refills | Status: DC

## 2024-01-04 MED ORDER — AMLODIPINE BESYLATE 5 MG PO TABS: 5.0000 mg | ORAL_TABLET | Freq: Every day | ORAL | 1 refills | Status: DC

## 2024-01-04 MED ORDER — EMPAGLIFLOZIN 25 MG PO TABS: 25.0000 mg | ORAL_TABLET | Freq: Every day | ORAL | 1 refills | Status: DC

## 2024-01-04 MED ORDER — ALBUTEROL SULFATE HFA 108 (90 BASE) MCG/ACT IN AERS: 2.0000 | INHALATION_SPRAY | Freq: Four times a day (QID) | RESPIRATORY_TRACT | 2 refills | Status: DC | PRN

## 2024-01-04 MED ORDER — GLIMEPIRIDE 2 MG PO TABS: 2.0000 mg | ORAL_TABLET | Freq: Every day | ORAL | 1 refills | Status: DC

## 2024-01-04 MED ORDER — LISINOPRIL 20 MG PO TABS: 20.0000 mg | ORAL_TABLET | Freq: Every day | ORAL | 1 refills | Status: DC

## 2024-01-04 NOTE — Telephone Encounter (Signed)
See not from pharmacy, medication is not covered by insurance

## 2024-01-04 NOTE — Progress Notes (Unsigned)
Provider: Richarda Blade FNP-C   Lavel Rieman, Donalee Citrin, NP  Patient Care Team: Katie Moch, Donalee Citrin, NP as PCP - General (Family Medicine) Pa, [facility]  Extended Emergency Contact Information Primary Emergency Contact: Pampo,Joseph Address: [address]          [place], [place] [address] [place] Home Phone: [phone] Relation: Spouse Secondary Emergency Contact: Franki Monte, [place] Home Phone: [phone] Relation: Relative  Code Status:  Full Code  Goals of care: Advanced Directive information    01/04/2024   11:02 AM  Advanced Directives  Does Patient Have a Medical Advance Directive? No  Would patient like information on creating a medical advance directive? No - Patient declined     Chief Complaint  Patient presents with  . Medical Management of Chronic Issues    6 month follow up and discuss diabetic kidney evaluation,colonoscopy,eye exam,foot exam,and flu vaccine.    HPI:  Pt is a 48 y.o. female seen today for medical management of chronic diseases.     Past Medical History:  Diagnosis Date  . Diabetes mellitus without complication (HCC)   . Hyperlipidemia   . Hypertension   . No pertinent past medical history    Past Surgical History:  Procedure Laterality Date  . CESAREAN SECTION N/A 08/22/2014   Procedure: CESAREAN SECTION;  Surgeon: Philip Aspen, DO;  Location: [facility];  Service: Obstetrics;  Laterality: N/A;, for breech presentation.  . TUBAL LIGATION Bilateral 08/22/2014   Procedure: BILATERAL TUBAL LIGATION;  Surgeon: Philip Aspen, DO;  Location: [facility];  Service: Obstetrics;  Laterality: Bilateral;  . WISDOM TOOTH EXTRACTION      No Known Allergies  Allergies as of 01/04/2024   No Known Allergies      Medication List        Accurate as of January 04, 2024 11:50 AM. If you have any questions, ask your nurse or doctor.          Accu-Chek Aviva Plus w/Device  Kit Check blood glucose twice daily before meals   Accu-Chek Softclix Lancets lancets Check blood glucose twice daily before meals.   acetaminophen 500 MG tablet Commonly known as: TYLENOL Take 500 mg by mouth every 6 (six) hours as needed for mild pain or headache.   albuterol 108 (90 Base) MCG/ACT inhaler Commonly known as: VENTOLIN HFA Inhale 2 puffs into the lungs every 6 (six) hours as needed for wheezing or shortness of breath.   atorvastatin 10 MG tablet Commonly known as: LIPITOR Take 1 tablet (10 mg total) by mouth daily.   blood glucose meter kit and supplies Dispense based on patient and insurance preference. Use up to four times daily as directed. (FOR ICD-10 E10.9, E11.9).   Denta 5000 Plus 1.1 % Crea dental cream Generic drug: sodium fluoride PLACE 1 APPLICATION ONTO TEETH EVERY EVENING.   empagliflozin 25 MG Tabs tablet Commonly known as: Jardiance Take 1 tablet (25 mg total) by mouth daily before breakfast.   glimepiride 2 MG tablet Commonly known as: AMARYL Take 1 tablet (2 mg total) by mouth daily before breakfast.   glucose blood test strip Commonly known as: Accu-Chek Aviva Plus Check blood glucose twice daily before meals   Janumet 50-1000 MG tablet Generic drug: sitaGLIPtin-metformin Take 1 tablet by mouth 2 (two) times daily with a meal.   lisinopril 20 MG tablet Commonly known as: ZESTRIL Take 1 tablet (20 mg  total) by mouth daily.   loratadine 10 MG tablet Commonly known as: CLARITIN Take 1 tablet (10 mg total) by mouth daily.        Review of Systems  Immunization History  Administered Date(s) Administered  . Influenza, Seasonal, Injecte, Preservative Fre 01/04/2024  . Influenza,inj,Quad PF,6+ Mos 08/24/2014  . Pneumococcal Polysaccharide-23 08/17/2019  . Td 01/27/2023   Pertinent  Health Maintenance Due  Topic Date Due  . OPHTHALMOLOGY EXAM  Never done  . Colonoscopy  Never done  . FOOT EXAM  07/30/2023  . HEMOGLOBIN A1C   01/03/2024  . INFLUENZA VACCINE  Completed      02/11/2022    2:26 PM 03/24/2022    2:54 PM 07/29/2022   10:03 AM 03/04/2023    9:34 AM 01/04/2024   11:01 AM  Fall Risk  Falls in the past year? 0 0 0 0 0  Was there an injury with Fall?    0 0  Fall Risk Category Calculator    0 0  (RETIRED) Patient Fall Risk Level Low fall risk Low fall risk Low fall risk    Patient at Risk for Falls Due to    No Fall Risks   Fall risk Follow up    Falls evaluation completed    Functional Status Survey:    Vitals:   01/04/24 1108 01/04/24 1117  BP: (!) 150/90 (!) 140/90  Pulse: 60   Resp: 20   Temp: 98 F (36.7 C)   SpO2: 97%   Weight: 173 lb 12.8 oz (78.8 kg)   Height: 5' (1.524 m)    Body mass index is 33.94 kg/m. Physical Exam  Labs reviewed: Recent Labs    01/27/23 0000 07/03/23 1120  NA 136 136  K 4.5 4.7  CL 105 103  CO2 26 28  GLUCOSE 163* 373*  BUN 10 11  CREATININE 0.80 0.87  CALCIUM 10.3* 11.4*   Recent Labs    01/27/23 0000 07/03/23 1120  AST 14 12  ALT 13 15  BILITOT 0.3 0.4  PROT 7.0 7.8   Recent Labs    01/27/23 0000 07/03/23 1120  WBC 3.3* 3.4*  NEUTROABS 1,505 1,465*  HGB 13.3 14.6  HCT 39.2 43.3  MCV 87.9 86.4  PLT 213 211   Lab Results  Component Value Date   TSH 1.99 07/03/2023   Lab Results  Component Value Date   HGBA1C >14.0 (H) 07/03/2023   Lab Results  Component Value Date   CHOL 182 07/03/2023   HDL 50 07/03/2023   LDLCALC 114 (H) 07/03/2023   TRIG 83 07/03/2023   CHOLHDL 3.6 07/03/2023    Significant Diagnostic Results in last 30 days:  No results found.  Assessment/Plan 1. Need for influenza vaccination (Primary) *** - Flu vaccine trivalent PF, 6mos and older(Flulaval,Afluria,Fluarix,Fluzone)    Family/ staff Communication: Reviewed plan of care with patient  Labs/tests ordered: None   Next Appointment :   Caesar Bookman, NP

## 2024-01-06 ENCOUNTER — Encounter: Payer: Self-pay | Admitting: Family

## 2024-01-06 ENCOUNTER — Ambulatory Visit (INDEPENDENT_AMBULATORY_CARE_PROVIDER_SITE_OTHER): Payer: Self-pay | Admitting: Family

## 2024-01-06 VITALS — BP 138/80 | HR 60 | Temp 97.9°F | Resp 19 | Ht 60.0 in | Wt 174.0 lb

## 2024-01-06 DIAGNOSIS — E119 Type 2 diabetes mellitus without complications: Secondary | ICD-10-CM

## 2024-01-06 LAB — CBC WITH DIFFERENTIAL/PLATELET
Absolute Lymphocytes: 1403 {cells}/uL (ref 850–3900)
Absolute Monocytes: 277 {cells}/uL (ref 200–950)
Basophils Absolute: 10 {cells}/uL (ref 0–200)
Basophils Relative: 0.3 %
Eosinophils Absolute: 30 {cells}/uL (ref 15–500)
Eosinophils Relative: 0.9 %
HCT: 44.4 % (ref 35.0–45.0)
Hemoglobin: 14.8 g/dL (ref 11.7–15.5)
MCH: 29.2 pg (ref 27.0–33.0)
MCHC: 33.3 g/dL (ref 32.0–36.0)
MCV: 87.7 fL (ref 80.0–100.0)
MPV: 11.9 fL (ref 7.5–12.5)
Monocytes Relative: 8.4 %
Neutro Abs: 1581 {cells}/uL (ref 1500–7800)
Neutrophils Relative %: 47.9 %
Platelets: 210 10*3/uL (ref 140–400)
RBC: 5.06 10*6/uL (ref 3.80–5.10)
RDW: 12.8 % (ref 11.0–15.0)
Total Lymphocyte: 42.5 %
WBC: 3.3 10*3/uL — ABNORMAL LOW (ref 3.8–10.8)

## 2024-01-06 LAB — HEMOGLOBIN A1C
Hgb A1c MFr Bld: 13.5 %{Hb} — ABNORMAL HIGH (ref <5.7)
Mean Plasma Glucose: 341 mg/dL
eAG (mmol/L): 18.9 mmol/L

## 2024-01-06 LAB — COMPLETE METABOLIC PANEL WITH GFR
AG Ratio: 1.2 (calc) (ref 1.0–2.5)
ALT: 11 U/L (ref 6–29)
AST: 15 U/L (ref 10–35)
Albumin: 4.1 g/dL (ref 3.6–5.1)
Alkaline phosphatase (APISO): 109 U/L (ref 31–125)
BUN: 8 mg/dL (ref 7–25)
CO2: 26 mmol/L (ref 20–32)
Calcium: 11 mg/dL — ABNORMAL HIGH (ref 8.6–10.2)
Chloride: 103 mmol/L (ref 98–110)
Creat: 0.81 mg/dL (ref 0.50–0.99)
Globulin: 3.4 g/dL (ref 1.9–3.7)
Glucose, Bld: 234 mg/dL — ABNORMAL HIGH (ref 65–99)
Potassium: 4.5 mmol/L (ref 3.5–5.3)
Sodium: 137 mmol/L (ref 135–146)
Total Bilirubin: 0.4 mg/dL (ref 0.2–1.2)
Total Protein: 7.5 g/dL (ref 6.1–8.1)
eGFR: 90 mL/min/{1.73_m2} (ref >=60)

## 2024-01-06 LAB — LIPID PANEL
Cholesterol: 184 mg/dL (ref <200)
HDL: 49 mg/dL — ABNORMAL LOW (ref >=50)
LDL Cholesterol (Calc): 117 mg/dL — ABNORMAL HIGH
Non-HDL Cholesterol (Calc): 135 mg/dL — ABNORMAL HIGH (ref <130)
Total CHOL/HDL Ratio: 3.8 (calc) (ref <5.0)
Triglycerides: 84 mg/dL (ref <150)

## 2024-01-06 LAB — VITAMIN D 25 HYDROXY (VIT D DEFICIENCY, FRACTURES): Vit D, 25-Hydroxy: 21 ng/mL — ABNORMAL LOW (ref 30–100)

## 2024-01-06 LAB — TEST AUTHORIZATION

## 2024-01-06 LAB — TSH: TSH: 2.91 m[IU]/L

## 2024-01-06 MED ORDER — ACCU-CHEK AVIVA PLUS W/DEVICE KIT: PACK | 0 refills | Status: AC

## 2024-01-06 MED ORDER — INSULIN GLARGINE 100 UNIT/ML SOLOSTAR PEN: 10.0000 [IU] | PEN_INJECTOR | Freq: Every day | SUBCUTANEOUS | 11 refills | Status: DC

## 2024-01-06 MED ORDER — ACCU-CHEK AVIVA PLUS VI STRP: ORAL_STRIP | 12 refills | Status: AC

## 2024-01-06 NOTE — Progress Notes (Signed)
 Provider: Roxan Plough FNP-C  Shaquile Lutze, Roxan BROCKS, NP  Patient Care Team: Urijah Arko, Roxan BROCKS, NP as PCP - General (Family Medicine) Pa, [facility]  Extended Emergency Contact Information Primary Emergency Contact: Pampo,Joseph Address: [address]          [place], [place] [address] [place] Home Phone: [phone] Relation: Spouse Secondary Emergency Contact: Anice India morita, [place] Home Phone: [phone] Relation: Relative  Code Status:  Full Code  Goals of care: Advanced Directive information    01/04/2024   11:02 AM  Advanced Directives  Does Patient Have a Medical Advance Directive? No  Would patient like information on creating a medical advance directive? No - Patient declined     Chief Complaint  Patient presents with   Acute Visit    Discuss labs.   Discussed the use of AI scribe software for clinical note transcription with the patient, who gave verbal consent to proceed.  History of Present Illness   Sarah Owens is a 48 year old female with diabetes who presents with Husband to discuss lab results..  She has a recent hemoglobin A1c of 13.5%, which is a decrease from over 14% previously, but remains significantly elevated. Her glucose level was recorded at 234 mg/dL. She experiences fatigue, headaches, and vision changes described as 'closing', which she attributes to her high blood sugar levels.  She is currently on diabetes medication, including metformin , she has not yet obtained a machine to check her blood sugar levels at home,  Her diet includes white bread, Cassava and corn. She engages in physical activity by walking from her house to a local market and back, which she does regularly.   Past Medical History:  Diagnosis Date   Diabetes mellitus without complication (HCC)    Hyperlipidemia    Hypertension    No pertinent past medical history    Past Surgical History:  Procedure  Laterality Date   CESAREAN SECTION N/A 08/22/2014   Procedure: CESAREAN SECTION;  Surgeon: Donna Just, DO;  Location: [facility];  Service: Obstetrics;  Laterality: N/A;, for breech presentation.   TUBAL LIGATION Bilateral 08/22/2014   Procedure: BILATERAL TUBAL LIGATION;  Surgeon: Donna Just, DO;  Location: [facility];  Service: Obstetrics;  Laterality: Bilateral;   WISDOM TOOTH EXTRACTION      No Known Allergies  Outpatient Encounter Medications as of 01/06/2024  Medication Sig   Accu-Chek Softclix Lancets lancets Check blood glucose twice daily before meals.   acetaminophen  (TYLENOL ) 500 MG tablet Take 500 mg by mouth every 6 (six) hours as needed for mild pain or headache.   albuterol  (VENTOLIN  HFA) 108 (90 Base) MCG/ACT inhaler Inhale 2 puffs into the lungs every 6 (six) hours as needed for wheezing or shortness of breath.   amLODipine  (NORVASC ) 5 MG tablet Take 1 tablet (5 mg total) by mouth daily.   atorvastatin  (LIPITOR) 10 MG tablet Take 1 tablet (10 mg total) by mouth daily.   blood glucose meter kit and supplies Dispense based on patient and insurance preference. Use up to four times daily as directed. (FOR ICD-10 E10.9, E11.9).   Blood Glucose Monitoring Suppl (ACCU-CHEK AVIVA PLUS) w/Device KIT Check blood glucose twice daily before meals   DENTA 5000 PLUS 1.1 % CREA dental cream PLACE 1 APPLICATION ONTO TEETH EVERY EVENING.   empagliflozin  (JARDIANCE ) 25 MG TABS tablet Take 1 tablet (25 mg total) by mouth daily before  breakfast.   glimepiride  (AMARYL ) 2 MG tablet Take 1 tablet (2 mg total) by mouth daily before breakfast.   glucose blood (ACCU-CHEK AVIVA PLUS) test strip Check blood glucose twice daily before meals   lisinopril  (ZESTRIL ) 20 MG tablet Take 1 tablet (20 mg total) by mouth daily.   loratadine  (CLARITIN ) 10 MG tablet Take 1 tablet (10 mg total) by mouth daily.   Sitagliptin  Base-Metformin  HCl (ZITUVIMET ) 50-1000 MG TABS Take 1 tablet by mouth in the morning and at  bedtime.   No facility-administered encounter medications on file as of 01/06/2024.    Review of Systems  Constitutional:  Positive for fatigue. Negative for appetite change, chills, fever and unexpected weight change.  HENT:  Negative for congestion, dental problem, ear discharge, ear pain, facial swelling, hearing loss, nosebleeds, postnasal drip, rhinorrhea, sinus pressure, sinus pain, sneezing and sore throat.   Eyes:  Negative for pain, discharge, redness, itching and visual disturbance.  Respiratory:  Negative for cough, chest tightness, shortness of breath and wheezing.   Cardiovascular:  Negative for chest pain, palpitations and leg swelling.  Gastrointestinal:  Negative for abdominal distention, abdominal pain, blood in stool, constipation, diarrhea, nausea and vomiting.  Endocrine: Negative for cold intolerance, heat intolerance, polydipsia, polyphagia and polyuria.  Musculoskeletal:  Negative for arthralgias, back pain, gait problem, joint swelling and myalgias.  Skin:  Negative for color change, pallor, rash and wound.  Neurological:  Negative for dizziness, weakness, light-headedness, numbness and headaches.  Psychiatric/Behavioral:  Negative for agitation, behavioral problems, confusion, hallucinations and sleep disturbance. The patient is not nervous/anxious.     Immunization History  Administered Date(s) Administered   Influenza, Seasonal, Injecte, Preservative Fre 01/04/2024   Influenza,inj,Quad PF,6+ Mos 08/24/2014   PNEUMOCOCCAL CONJUGATE-20 01/04/2024   Pneumococcal Polysaccharide-23 08/17/2019   Td 01/27/2023   Pertinent  Health Maintenance Due  Topic Date Due   OPHTHALMOLOGY EXAM  Never done   Colonoscopy  Never done   FOOT EXAM  07/30/2023   HEMOGLOBIN A1C  07/03/2024   INFLUENZA VACCINE  Completed      02/11/2022    2:26 PM 03/24/2022    2:54 PM 07/29/2022   10:03 AM 03/04/2023    9:34 AM 01/04/2024   11:01 AM  Fall Risk  Falls in the past year? 0 0 0 0 0   Was there an injury with Fall?    0 0  Fall Risk Category Calculator    0 0  (RETIRED) Patient Fall Risk Level Low fall risk Low fall risk Low fall risk    Patient at Risk for Falls Due to    No Fall Risks   Fall risk Follow up    Falls evaluation completed    Functional Status Survey:    Vitals:   01/06/24 1501  BP: 138/80  Pulse: 60  Resp: 19  Temp: 97.9 F (36.6 C)  SpO2: 98%  Weight: 174 lb (78.9 kg)  Height: 5' (1.524 m)   Body mass index is 33.98 kg/m. Physical Exam Vitals reviewed.  Constitutional:      General: She is not in acute distress.    Appearance: Normal appearance. She is obese. She is not ill-appearing or diaphoretic.  HENT:     Head: Normocephalic.     Mouth/Throat:     Mouth: Mucous membranes are moist.     Pharynx: Oropharynx is clear. No oropharyngeal exudate or posterior oropharyngeal erythema.  Eyes:     General: No scleral icterus.  Right eye: No discharge.        Left eye: No discharge.     Conjunctiva/sclera: Conjunctivae normal.     Pupils: Pupils are equal, round, and reactive to light.  Neck:     Vascular: No carotid bruit.  Cardiovascular:     Rate and Rhythm: Normal rate and regular rhythm.     Pulses: Normal pulses.     Heart sounds: Normal heart sounds. No murmur heard.    No friction rub. No gallop.  Pulmonary:     Effort: Pulmonary effort is normal. No respiratory distress.     Breath sounds: Normal breath sounds. No wheezing, rhonchi or rales.  Chest:     Chest wall: No tenderness.  Abdominal:     General: Bowel sounds are normal. There is no distension.     Palpations: Abdomen is soft. There is no mass.     Tenderness: There is no abdominal tenderness. There is no right CVA tenderness, left CVA tenderness, guarding or rebound.  Musculoskeletal:        General: No swelling or tenderness. Normal range of motion.     Cervical back: Normal range of motion. No rigidity or tenderness.     Right lower leg: No edema.      Left lower leg: No edema.  Lymphadenopathy:     Cervical: No cervical adenopathy.  Skin:    General: Skin is warm and dry.     Coloration: Skin is not pale.     Findings: No bruising, erythema, lesion or rash.  Neurological:     Mental Status: She is alert and oriented to person, place, and time.     Sensory: No sensory deficit.     Motor: No weakness.     Gait: Gait normal.  Psychiatric:        Mood and Affect: Mood normal.        Speech: Speech normal.        Behavior: Behavior normal.     Labs reviewed: Recent Labs    01/27/23 0000 07/03/23 1120 01/04/24 1220  NA 136 136 137  K 4.5 4.7 4.5  CL 105 103 103  CO2 26 28 26   GLUCOSE 163* 373* 234*  BUN 10 11 8   CREATININE 0.80 0.87 0.81  CALCIUM  10.3* 11.4* 11.0*   Recent Labs    01/27/23 0000 07/03/23 1120 01/04/24 1220  AST 14 12 15   ALT 13 15 11   BILITOT 0.3 0.4 0.4  PROT 7.0 7.8 7.5   Recent Labs    01/27/23 0000 07/03/23 1120 01/04/24 1220  WBC 3.3* 3.4* 3.3*  NEUTROABS 1,505 1,465* 1,581  HGB 13.3 14.6 14.8  HCT 39.2 43.3 44.4  MCV 87.9 86.4 87.7  PLT 213 211 210   Lab Results  Component Value Date   TSH 2.91 01/04/2024   Lab Results  Component Value Date   HGBA1C 13.5 (H) 01/04/2024   Lab Results  Component Value Date   CHOL 184 01/04/2024   HDL 49 (L) 01/04/2024   LDLCALC 117 (H) 01/04/2024   TRIG 84 01/04/2024   CHOLHDL 3.8 01/04/2024    Significant Diagnostic Results in last 30 days:  No results found.  Assessment/Plan  Type 2 Diabetes Mellitus Hemoglobin A1c is significantly elevated at 13.5%, indicating poorly controlled diabetes. Glucose level is also high at 234 mg/dL. Reports fatigue, visual disturbances, and headaches, likely related to hyperglycemia. Not consistently monitoring blood glucose levels and lacks necessary equipment. Discussed the importance of insulin  therapy  to lower blood glucose levels and prevent complications such as retinopathy and nephropathy. Explained  that insulin  (Lantus ) is a long-acting insulin  that works for 24 hours and should be administered in the evening. Emphasized dietary modifications, including reducing intake of high-sugar foods and incorporating healthier options. Encouraged daily blood glucose monitoring and maintaining a log of readings. - Prescribe insulin  (Lantus ) 10 units daily, to be administered in the evening. - Educate on dietary modifications, including reducing intake of high-sugar foods and incorporating healthier options. - Provide a prescription for glucose monitoring strips and a glucometer. - Advise to maintain a log of blood glucose readings and bring it to the next appointment. - Schedule a follow-up appointment in two weeks to review blood glucose logs and adjust treatment as necessary. - Blood Glucose Monitoring Suppl (ACCU-CHEK AVIVA PLUS) w/Device KIT; Check blood glucose twice daily before meals  Dispense: 1 kit; Refill: 0 - glucose blood (ACCU-CHEK AVIVA PLUS) test strip; Check blood glucose twice daily before meals  Dispense: 100 each; Refill: 12 - insulin  glargine (LANTUS ) 100 UNIT/ML Solostar Pen; Inject 10 Units into the skin daily.  Dispense: 15 mL; Refill: 11  Hypercalcemia Elevated calcium  levels, potentially due to hyperparathyroidism or vitamin D  imbalance. Further investigation required to determine the underlying cause. - Order lab tests to check vitamin D  levels and parathyroid hormone (PTH) levels. - PTH, intact and calcium   General Health Maintenance Blood pressure is well-controlled at 138/80 mmHg, and other vital signs are within normal limits. Encouraged to maintain a healthy lifestyle to manage diabetes and overall health. Discussed the benefits of regular physical activity and a balanced diet. - Encourage regular physical activity, such as walking daily. - Advise to consume a balanced diet rich in vegetables and low in high-sugar foods. - Recommend healthy snacks like apples and carrots  to avoid spikes in blood sugar.  Follow-up - Schedule a follow-up appointment in two weeks to review blood glucose logs and adjust treatment as necessary. - Follow up on lab results for vitamin D  and parathyroid hormone levels.    Family/ staff Communication: Reviewed plan of care with patient verbalized understanding   Labs/tests ordered: - PTH, intact and calcium   Next Appointment: Return if symptoms worsen or fail to improve.   Total time: 23 minutes. Greater than 50% of total time spent doing patient education regarding T2DM,Hypercalcemia ,health maintenance including symptom/medication management.   Roxan JAYSON Plough, NP

## 2024-01-07 ENCOUNTER — Other Ambulatory Visit: Payer: Self-pay | Admitting: Adult Health

## 2024-01-07 DIAGNOSIS — E559 Vitamin D deficiency, unspecified: Secondary | ICD-10-CM

## 2024-01-07 LAB — PTH, INTACT AND CALCIUM
Calcium: 10.9 mg/dL — ABNORMAL HIGH (ref 8.6–10.2)
PTH: 102 pg/mL — ABNORMAL HIGH (ref 16–77)

## 2024-01-07 MED ORDER — VITAMIN D (ERGOCALCIFEROL) 1.25 MG (50000 UNIT) PO CAPS: 50000.0000 [IU] | ORAL_CAPSULE | ORAL | 1 refills | Status: DC

## 2024-01-07 NOTE — Progress Notes (Signed)
-    PTH intact elevated and Calcium  10.9, down from 11.0 - Has vitamin D  low, sent eRx to pharmacy for Vitamin D  50,000 international unit  weekly, will need vitamin D  and calcium  re check again in a month.

## 2024-01-18 ENCOUNTER — Encounter: Payer: Self-pay | Admitting: Family

## 2024-01-18 NOTE — Progress Notes (Signed)
   This encounter was created in error - please disregard. No show

## 2024-01-31 ENCOUNTER — Other Ambulatory Visit: Payer: Self-pay | Admitting: Adult Health

## 2024-01-31 DIAGNOSIS — E559 Vitamin D deficiency, unspecified: Secondary | ICD-10-CM

## 2024-02-01 ENCOUNTER — Encounter: Payer: Self-pay | Admitting: Family

## 2024-02-01 ENCOUNTER — Ambulatory Visit: Payer: Self-pay | Admitting: Family

## 2024-02-01 VITALS — BP 130/72 | HR 60 | Temp 97.6°F | Resp 20 | Ht 60.0 in | Wt 177.8 lb

## 2024-02-01 DIAGNOSIS — G8929 Other chronic pain: Secondary | ICD-10-CM

## 2024-02-01 DIAGNOSIS — E785 Hyperlipidemia, unspecified: Secondary | ICD-10-CM

## 2024-02-01 DIAGNOSIS — I10 Essential (primary) hypertension: Secondary | ICD-10-CM

## 2024-02-01 DIAGNOSIS — E1169 Type 2 diabetes mellitus with other specified complication: Secondary | ICD-10-CM

## 2024-02-01 DIAGNOSIS — E119 Type 2 diabetes mellitus without complications: Secondary | ICD-10-CM

## 2024-02-01 DIAGNOSIS — R519 Headache, unspecified: Secondary | ICD-10-CM

## 2024-02-01 DIAGNOSIS — J301 Allergic rhinitis due to pollen: Secondary | ICD-10-CM

## 2024-02-01 MED ORDER — LISINOPRIL 20 MG PO TABS: 20.0000 mg | ORAL_TABLET | Freq: Every day | ORAL | 1 refills | Status: DC

## 2024-02-01 MED ORDER — AMLODIPINE BESYLATE 5 MG PO TABS: 5.0000 mg | ORAL_TABLET | Freq: Every day | ORAL | 1 refills | Status: DC

## 2024-02-01 MED ORDER — GLIMEPIRIDE 2 MG PO TABS: 2.0000 mg | ORAL_TABLET | Freq: Every day | ORAL | 1 refills | Status: DC

## 2024-02-01 MED ORDER — ATORVASTATIN CALCIUM 10 MG PO TABS
10.0000 mg | ORAL_TABLET | Freq: Every day | ORAL | 3 refills | Status: AC
Start: 2024-02-01 — End: ?

## 2024-02-01 MED ORDER — EMPAGLIFLOZIN 25 MG PO TABS: 25.0000 mg | ORAL_TABLET | Freq: Every day | ORAL | 1 refills | Status: DC

## 2024-02-01 MED ORDER — INSULIN GLARGINE 100 UNIT/ML SOLOSTAR PEN: 10.0000 [IU] | PEN_INJECTOR | Freq: Every day | SUBCUTANEOUS | 11 refills | Status: DC

## 2024-02-01 MED ORDER — SITAGLIPTIN BASE-METFORMIN HCL 50-1000 MG PO TABS: 1.0000 | ORAL_TABLET | Freq: Two times a day (BID) | ORAL | 1 refills | Status: AC

## 2024-02-01 NOTE — Patient Instructions (Signed)
 Please call to schedule appointment:  [facility] [place] [address], [place] [address] [phone]   Called pt with interpreter on the line to make her aware [facility]

## 2024-02-02 ENCOUNTER — Telehealth: Payer: Self-pay

## 2024-02-02 LAB — MICROALBUMIN / CREATININE URINE RATIO
Creatinine, Urine: 120 mg/dL (ref 20–275)
Microalb Creat Ratio: 3 mg/g{creat} (ref <30)
Microalb, Ur: 0.4 mg/dL

## 2024-02-02 NOTE — Telephone Encounter (Signed)
 Incoming fax received from patients pharmacy to initiate a prior authorization for                   .  PA initiated through covermymeds. Key: [id number]  Awaiting reply from the insurance company which will be determined in 48-72 hours.

## 2024-02-07 NOTE — Progress Notes (Signed)
 Provider: Richarda Blade FNP-C  Darion Juhasz, Donalee Citrin, NP  Patient Care Team: Cosmo Tetreault, Donalee Citrin, NP as PCP - General (Family Medicine) Pa, [facility]  Extended Emergency Contact Information Primary Emergency Contact: Pampo,Joseph Address: [address]          [place], [place] [address] [place] Home Phone: [phone] Relation: Spouse Secondary Emergency Contact: Franki Monte, [place] Home Phone: [phone] Relation: Relative  Code Status:  Full Code  Goals of care: Advanced Directive information    02/01/2024    2:49 PM  Advanced Directives  Does Patient Have a Medical Advance Directive? No  Would patient like information on creating a medical advance directive? No - Patient declined     Chief Complaint  Patient presents with   Follow-up    2 week follow up on blood pressure.    Discussed the use of AI scribe software for clinical note transcription with the patient, who gave verbal consent to proceed.  History of Present Illness   Sarah Owens is a 48 year old female with hypertension and diabetes who presents for medication management and follow-up. She is here with [facility] Swahili interpreter.   She is currently managing hypertension and diabetes. She takes Tylenol for headaches, Claritin for allergies, and albuterol as needed. Her cholesterol medication has run out and requires a refill. There is an issue with obtaining insulin from the pharmacy for her diabetes management. she brought in her blood pressure log readings ranging in the 110's.70 - 130's/80's with HR 70's -80's  Had CBG log in her phone readings in the 140's -180's with few readings in the low 200's.she denies any signs of hypoglycemia or hyperglycemia.  She has been experiencing headaches when her blood pressure was still high but not recently. No fever is present.   Past Medical History:  Diagnosis Date   Diabetes mellitus without  complication (HCC)    Hyperlipidemia    Hypertension    No pertinent past medical history    Past Surgical History:  Procedure Laterality Date   CESAREAN SECTION N/A 08/22/2014   Procedure: CESAREAN SECTION;  Surgeon: Philip Aspen, DO;  Location: [facility];  Service: Obstetrics;  Laterality: N/A;, for breech presentation.   TUBAL LIGATION Bilateral 08/22/2014   Procedure: BILATERAL TUBAL LIGATION;  Surgeon: Philip Aspen, DO;  Location: [facility];  Service: Obstetrics;  Laterality: Bilateral;   WISDOM TOOTH EXTRACTION      No Known Allergies  Outpatient Encounter Medications as of 02/01/2024  Medication Sig   Accu-Chek Softclix Lancets lancets Check blood glucose twice daily before meals.   acetaminophen (TYLENOL) 500 MG tablet Take 500 mg by mouth every 6 (six) hours as needed for mild pain or headache.   albuterol (VENTOLIN HFA) 108 (90 Base) MCG/ACT inhaler Inhale 2 puffs into the lungs every 6 (six) hours as needed for wheezing or shortness of breath.   amLODipine (NORVASC) 5 MG tablet Take 1 tablet (5 mg total) by mouth daily.   atorvastatin (LIPITOR) 10 MG tablet Take 1 tablet (10 mg total) by mouth daily.   blood glucose meter kit and supplies Dispense based on patient and insurance preference. Use up to four times daily as directed. (FOR ICD-10 E10.9, E11.9).   Blood Glucose Monitoring Suppl (ACCU-CHEK AVIVA PLUS) w/Device KIT Check blood glucose twice daily before meals   DENTA 5000 PLUS 1.1 % CREA dental cream PLACE 1  APPLICATION ONTO TEETH EVERY EVENING.   empagliflozin (JARDIANCE) 25 MG TABS tablet Take 1 tablet (25 mg total) by mouth daily before breakfast.   glimepiride (AMARYL) 2 MG tablet Take 1 tablet (2 mg total) by mouth daily before breakfast.   glucose blood (ACCU-CHEK AVIVA PLUS) test strip Check blood glucose twice daily before meals   insulin glargine (LANTUS) 100 UNIT/ML Solostar Pen Inject 10 Units into the skin daily.   lisinopril (ZESTRIL) 20 MG tablet Take 1  tablet (20 mg total) by mouth daily.   loratadine (CLARITIN) 10 MG tablet Take 1 tablet (10 mg total) by mouth daily.   Sitagliptin Base-Metformin HCl (ZITUVIMET) 50-1000 MG TABS Take 1 tablet by mouth in the morning and at bedtime.   Vitamin D, Ergocalciferol, (DRISDOL) 1.25 MG (50000 UNIT) CAPS capsule Take 1 capsule (50,000 Units total) by mouth every 7 (seven) days. E55.9   [DISCONTINUED] amLODipine (NORVASC) 5 MG tablet Take 1 tablet (5 mg total) by mouth daily.   [DISCONTINUED] atorvastatin (LIPITOR) 10 MG tablet Take 1 tablet (10 mg total) by mouth daily.   [DISCONTINUED] empagliflozin (JARDIANCE) 25 MG TABS tablet Take 1 tablet (25 mg total) by mouth daily before breakfast.   [DISCONTINUED] glimepiride (AMARYL) 2 MG tablet Take 1 tablet (2 mg total) by mouth daily before breakfast.   [DISCONTINUED] insulin glargine (LANTUS) 100 UNIT/ML Solostar Pen Inject 10 Units into the skin daily.   [DISCONTINUED] lisinopril (ZESTRIL) 20 MG tablet Take 1 tablet (20 mg total) by mouth daily.   [DISCONTINUED] Sitagliptin Base-Metformin HCl (ZITUVIMET) 50-1000 MG TABS Take 1 tablet by mouth in the morning and at bedtime.   [DISCONTINUED] Vitamin D, Ergocalciferol, (DRISDOL) 1.25 MG (50000 UNIT) CAPS capsule Take 1 capsule (50,000 Units total) by mouth every 7 (seven) days.   No facility-administered encounter medications on file as of 02/01/2024.    Review of Systems  Constitutional:  Negative for appetite change, chills, fatigue, fever and unexpected weight change.  HENT:  Negative for congestion, dental problem, ear discharge, ear pain, facial swelling, hearing loss, nosebleeds, postnasal drip, rhinorrhea, sinus pressure, sinus pain, sneezing, sore throat, tinnitus and trouble swallowing.   Eyes:  Negative for pain, discharge, redness, itching and visual disturbance.  Respiratory:  Negative for cough, chest tightness, shortness of breath and wheezing.   Cardiovascular:  Negative for chest pain,  palpitations and leg swelling.  Gastrointestinal:  Negative for abdominal distention, abdominal pain, blood in stool, constipation, diarrhea, nausea and vomiting.  Endocrine: Negative for cold intolerance, heat intolerance, polydipsia, polyphagia and polyuria.  Genitourinary:  Negative for difficulty urinating, dysuria, flank pain, frequency and urgency.  Musculoskeletal:  Negative for arthralgias, back pain, gait problem, joint swelling, myalgias, neck pain and neck stiffness.  Skin:  Negative for color change, pallor, rash and wound.  Neurological:  Negative for dizziness, syncope, speech difficulty, weakness, light-headedness, numbness and headaches.  Hematological:  Does not bruise/bleed easily.  Psychiatric/Behavioral:  Negative for agitation, behavioral problems, confusion, hallucinations, self-injury, sleep disturbance and suicidal ideas. The patient is not nervous/anxious.     Immunization History  Administered Date(s) Administered   Influenza, Seasonal, Injecte, Preservative Fre 01/04/2024   Influenza,inj,Quad PF,6+ Mos 08/24/2014   PNEUMOCOCCAL CONJUGATE-20 01/04/2024   Pneumococcal Polysaccharide-23 08/17/2019   Td 01/27/2023   Pertinent  Health Maintenance Due  Topic Date Due   OPHTHALMOLOGY EXAM  Never done   Colonoscopy  Never done   FOOT EXAM  07/30/2023   HEMOGLOBIN A1C  07/03/2024   INFLUENZA VACCINE  Completed  02/11/2022    2:26 PM 03/24/2022    2:54 PM 07/29/2022   10:03 AM 03/04/2023    9:34 AM 01/04/2024   11:01 AM  Fall Risk  Falls in the past year? 0 0 0 0 0  Was there an injury with Fall?    0 0  Fall Risk Category Calculator    0 0  (RETIRED) Patient Fall Risk Level Low fall risk Low fall risk Low fall risk    Patient at Risk for Falls Due to    No Fall Risks   Fall risk Follow up    Falls evaluation completed    Functional Status Survey:    Vitals:   02/01/24 1450  BP: 130/72  Pulse: 60  Resp: 20  Temp: 97.6 F (36.4 C)  SpO2: 99%  Weight:  177 lb 12.8 oz (80.6 kg)  Height: 5' (1.524 m)   Body mass index is 34.72 kg/m. Physical Exam  VITALS: T- 97.6, P- 60, BP- 130/72, SaO2- 99% GENERAL: Alert, cooperative, well developed, no acute distress HEENT: Normocephalic, normal oropharynx, moist mucous membranes, eyes normal CHEST: Clear to auscultation bilaterally, no wheezes, rhonchi, or crackles CARDIOVASCULAR: Normal heart rate and rhythm, S1 and S2 normal without murmurs ABDOMEN: Soft, non-tender, non-distended, without organomegaly, normal bowel sounds EXTREMITIES: No cyanosis or edema NEUROLOGICAL: Cranial nerves grossly intact, moves all extremities without gross motor or sensory deficit SKIN: No rash, No lesion  PYSCHIATRY/BEHAVIORAL: mood stable   Labs reviewed: Recent Labs    07/03/23 1120 01/04/24 1220 01/06/24 1547  NA 136 137  --   K 4.7 4.5  --   CL 103 103  --   CO2 28 26  --   GLUCOSE 373* 234*  --   BUN 11 8  --   CREATININE 0.87 0.81  --   CALCIUM 11.4* 11.0* 10.9*   Recent Labs    07/03/23 1120 01/04/24 1220  AST 12 15  ALT 15 11  BILITOT 0.4 0.4  PROT 7.8 7.5   Recent Labs    07/03/23 1120 01/04/24 1220  WBC 3.4* 3.3*  NEUTROABS 1,465* 1,581  HGB 14.6 14.8  HCT 43.3 44.4  MCV 86.4 87.7  PLT 211 210   Lab Results  Component Value Date   TSH 2.91 01/04/2024   Lab Results  Component Value Date   HGBA1C 13.5 (H) 01/04/2024   Lab Results  Component Value Date   CHOL 184 01/04/2024   HDL 49 (L) 01/04/2024   LDLCALC 117 (H) 01/04/2024   TRIG 84 01/04/2024   CHOLHDL 3.8 01/04/2024    Significant Diagnostic Results in last 30 days:  No results found.  Assessment/Plan  Headache Reports experiencing headaches. Suspect blood pressure or allergies related  - Advise Tylenol as needed for relief.  Type 2 Diabetes Mellitus Requires insulin for management. Issue with obtaining insulin from pharmacy. Discussed importance of insulin in preventing complications such as neuropathy  and retinopathy. - Ensure insulin prescription is sent to pharmacy. - Provide at least three refills for insulin.  Hypertension Blood pressure well-controlled at 130/72 mmHg. Compliant with medication regimen. Discussed importance of adherence to prevent complications such as stroke and heart disease. - Continue current antihypertensive medication. - Ensure medication refills are available; instruct to contact pharmacy or clinic if refills are needed.  Hyperlipidemia Requires medication for cholesterol management. Discussed importance of cholesterol control to reduce cardiovascular risk. - Provide refill for cholesterol medication.  Asthma Using albuterol for management. Discussed importance of using albuterol  during exacerbations to prevent severe attacks. - Continue albuterol as needed for symptoms.  Allergic Rhinitis Using Claritin for nasal congestion. Discussed benefits of continued use to manage symptoms. - Continue Claritin as needed for symptoms.  General Health Maintenance Advised on dietary modifications to manage hunger and maintain balanced diet. Discussed benefits of consuming more vegetables and reducing intake of starchy foods like potatoes to manage weight and blood sugar levels. - Encourage consumption of more vegetables and reduction of starchy foods like potatoes.  Follow-up - Schedule follow-up appointment in four months. - Check referral status for gastroenterology and ensure contact. - Provide referral information for [facility].   Family/ staff Communication: Reviewed plan of care with patient verbalized understanding   Labs/tests ordered: None   Next Appointment: Return in about 4 months (around 06/02/2024) for medical mangement of chronic issues., fasting labs prior to visit.   Total time: 35 minutes. Greater than 50% of total time spent doing patient education regarding T2DM,HTN, HLD,headache,Asthma,Allergies,health  maintenance including symptom/medication management.   Caesar Bookman, NP

## 2024-02-22 ENCOUNTER — Encounter: Payer: Self-pay | Admitting: Family

## 2024-02-22 NOTE — Progress Notes (Signed)
   This encounter was created in error - please disregard. No show

## 2024-03-21 ENCOUNTER — Encounter: Payer: Self-pay | Admitting: Family

## 2024-03-25 ENCOUNTER — Other Ambulatory Visit: Payer: Self-pay | Admitting: Family

## 2024-03-25 DIAGNOSIS — E1169 Type 2 diabetes mellitus with other specified complication: Secondary | ICD-10-CM

## 2024-03-25 DIAGNOSIS — J9801 Acute bronchospasm: Secondary | ICD-10-CM

## 2024-03-25 DIAGNOSIS — K029 Dental caries, unspecified: Secondary | ICD-10-CM

## 2024-03-25 DIAGNOSIS — I10 Essential (primary) hypertension: Secondary | ICD-10-CM

## 2024-03-25 MED ORDER — AMLODIPINE BESYLATE 5 MG PO TABS: 5.0000 mg | ORAL_TABLET | Freq: Every day | ORAL | 1 refills | Status: DC

## 2024-03-25 MED ORDER — GLIMEPIRIDE 2 MG PO TABS: 2.0000 mg | ORAL_TABLET | Freq: Every day | ORAL | 1 refills | Status: DC

## 2024-03-25 MED ORDER — SODIUM FLUORIDE 1.1 % DT CREA: 1.0000 | TOPICAL_CREAM | Freq: Every day | DENTAL | 1 refills | Status: AC

## 2024-03-25 MED ORDER — ALBUTEROL SULFATE HFA 108 (90 BASE) MCG/ACT IN AERS: 2.0000 | INHALATION_SPRAY | Freq: Four times a day (QID) | RESPIRATORY_TRACT | 2 refills | Status: AC | PRN

## 2024-03-25 NOTE — Telephone Encounter (Signed)
 Copied from CRM [id number]. Topic: Clinical - Medication Refill >> Mar 25, 2024  2:10 PM Tiffany H wrote: Most Recent Primary Care Visit:  Provider: Christean Courts C  Department: [facility]  Visit Type: OFFICE VISIT  Date: 02/01/2024  Medication: albuterol  (VENTOLIN  HFA) 108 (90 Base) MCG/ACT inhaler  amLODipine  (NORVASC ) 5 MG tablet  Denta 5000 Plus 1.1 % Crea dental cream glimepiride  (AMARYL ) 2 MG tablet   Patient was told that she had no refills on the above medications. Meds list confirms several refills were sent. Please assist. Caretaker will call back to reconcile medications.    Has the patient contacted their pharmacy? Yes (Agent: If no, request that the patient contact the pharmacy for the refill. If patient does not wish to contact the pharmacy document the reason why and proceed with request.) (Agent: If yes, when and what did the pharmacy advise?)  Is this the correct pharmacy for this prescription? Yes If no, delete pharmacy and type the correct one.  This is the patient's preferred pharmacy:  [id number] - [place], [place] - [address] AT [facility] [address] [place] [address] Phone: [phone] Fax: [phone]   Has the prescription been filled recently? No  Is the patient out of the medication? No  Has the patient been seen for an appointment in the last year OR does the patient have an upcoming appointment? Yes  Can we respond through MyChart? No  Agent: Please be advised that Rx refills may take up to 3 business days. We ask that you follow-up with your pharmacy.

## 2024-06-02 ENCOUNTER — Other Ambulatory Visit: Payer: Self-pay

## 2024-06-02 DIAGNOSIS — E785 Hyperlipidemia, unspecified: Secondary | ICD-10-CM

## 2024-06-02 DIAGNOSIS — I10 Essential (primary) hypertension: Secondary | ICD-10-CM

## 2024-06-02 DIAGNOSIS — E1169 Type 2 diabetes mellitus with other specified complication: Secondary | ICD-10-CM

## 2024-06-06 ENCOUNTER — Other Ambulatory Visit

## 2024-06-06 DIAGNOSIS — I10 Essential (primary) hypertension: Secondary | ICD-10-CM | POA: Diagnosis not present

## 2024-06-06 DIAGNOSIS — E785 Hyperlipidemia, unspecified: Secondary | ICD-10-CM | POA: Diagnosis not present

## 2024-06-06 DIAGNOSIS — E1169 Type 2 diabetes mellitus with other specified complication: Secondary | ICD-10-CM | POA: Diagnosis not present

## 2024-06-07 ENCOUNTER — Other Ambulatory Visit: Payer: Self-pay

## 2024-06-08 ENCOUNTER — Ambulatory Visit: Payer: Self-pay | Admitting: Nurse Practitioner

## 2024-06-10 ENCOUNTER — Ambulatory Visit: Payer: Self-pay | Admitting: Family

## 2024-06-10 LAB — CBC WITH DIFFERENTIAL/PLATELET
Absolute Lymphocytes: 1218 {cells}/uL (ref 850–3900)
Absolute Monocytes: 354 {cells}/uL (ref 200–950)
Basophils Absolute: 20 {cells}/uL (ref 0–200)
Basophils Relative: 0.7 %
Eosinophils Absolute: 29 {cells}/uL (ref 15–500)
Eosinophils Relative: 1 %
HCT: 41.6 % (ref 35.0–45.0)
Hemoglobin: 13.5 g/dL (ref 11.7–15.5)
MCH: 28.6 pg (ref 27.0–33.0)
MCHC: 32.5 g/dL (ref 32.0–36.0)
MCV: 88.1 fL (ref 80.0–100.0)
MPV: 11.4 fL (ref 7.5–12.5)
Monocytes Relative: 12.2 %
Neutro Abs: 1279 {cells}/uL — ABNORMAL LOW (ref 1500–7800)
Neutrophils Relative %: 44.1 %
Platelets: 199 Thousand/uL (ref 140–400)
RBC: 4.72 Million/uL (ref 3.80–5.10)
RDW: 13.6 % (ref 11.0–15.0)
Total Lymphocyte: 42 %
WBC: 2.9 Thousand/uL — ABNORMAL LOW (ref 3.8–10.8)

## 2024-06-10 LAB — LIPID PANEL
Cholesterol: 175 mg/dL (ref <200)
HDL: 49 mg/dL — ABNORMAL LOW (ref >=50)
LDL Cholesterol (Calc): 110 mg/dL — ABNORMAL HIGH
Non-HDL Cholesterol (Calc): 126 mg/dL (ref <130)
Total CHOL/HDL Ratio: 3.6 (calc) (ref <5.0)
Triglycerides: 70 mg/dL (ref <150)

## 2024-06-10 LAB — COMPREHENSIVE METABOLIC PANEL WITH GFR
AG Ratio: 1.3 (calc) (ref 1.0–2.5)
ALT: 16 U/L (ref 6–29)
AST: 19 U/L (ref 10–35)
Albumin: 4.2 g/dL (ref 3.6–5.1)
Alkaline phosphatase (APISO): 94 U/L (ref 31–125)
BUN: 9 mg/dL (ref 7–25)
CO2: 24 mmol/L (ref 20–32)
Calcium: 10.5 mg/dL — ABNORMAL HIGH (ref 8.6–10.2)
Chloride: 103 mmol/L (ref 98–110)
Creat: 0.69 mg/dL (ref 0.50–0.99)
Globulin: 3.2 g/dL (ref 1.9–3.7)
Glucose, Bld: 224 mg/dL — ABNORMAL HIGH (ref 65–99)
Potassium: 4.2 mmol/L (ref 3.5–5.3)
Sodium: 135 mmol/L (ref 135–146)
Total Bilirubin: 0.4 mg/dL (ref 0.2–1.2)
Total Protein: 7.4 g/dL (ref 6.1–8.1)
eGFR: 108 mL/min/1.73m2 (ref >=60)

## 2024-06-10 LAB — TEST AUTHORIZATION

## 2024-06-10 LAB — HEMOGLOBIN A1C
Hgb A1c MFr Bld: 12.4 % — ABNORMAL HIGH (ref <5.7)
Mean Plasma Glucose: 309 mg/dL
eAG (mmol/L): 17.1 mmol/L

## 2024-06-10 LAB — TSH: TSH: 2.5 m[IU]/L

## 2024-06-10 NOTE — Progress Notes (Unsigned)
 Location:   [facility]   Place of Service:    Provider: Larwance Hark NP  Code Status: DNR Goals of Care:     06/13/2024   10:44 AM  Advanced Directives  Does Patient Have a Medical Advance Directive? No  Would patient like information on creating a medical advance directive? No - Patient declined     Chief Complaint  Patient presents with  . Medical Management of Chronic Issues    4 month follow up    HPI: Patient is a 48 y.o. female seen today for an acute visit for Discussed the use of AI scribe software for clinical note transcription with the patient, who gave verbal consent to proceed.  History of Present Illness  HTN, on Amlodipine , Lisinopril , Bun/creat 9/0.69 06/06/24  HLD, on Atorvastatin , LDL 110 06/06/24  T2DM, on Glimepiride , Jardiance ,  insulin , Hgb A1c 12.4 06/06/24    Past Medical History:  Diagnosis Date  . Diabetes mellitus without complication (HCC)   . Hyperlipidemia   . Hypertension   . No pertinent past medical history     Past Surgical History:  Procedure Laterality Date  . CESAREAN SECTION N/A 08/22/2014   Procedure: CESAREAN SECTION;  Surgeon: Donna Just, DO;  Location: [facility];  Service: Obstetrics;  Laterality: N/A;, for breech presentation.  . TUBAL LIGATION Bilateral 08/22/2014   Procedure: BILATERAL TUBAL LIGATION;  Surgeon: Donna Just, DO;  Location: [facility];  Service: Obstetrics;  Laterality: Bilateral;  . WISDOM TOOTH EXTRACTION      No Known Allergies  Allergies as of 06/13/2024   No Known Allergies      Medication List        Accurate as of June 13, 2024 11:59 PM. If you have any questions, ask your nurse or doctor.          Accu-Chek Aviva Plus test strip Generic drug: glucose blood Check blood glucose twice daily before meals   Accu-Chek Aviva Plus w/Device Kit Check blood glucose twice daily before meals   Accu-Chek Softclix Lancets lancets Check blood glucose twice daily before meals.   acetaminophen   500 MG tablet Commonly known as: TYLENOL  Take 500 mg by mouth every 6 (six) hours as needed for mild pain or headache.   albuterol  108 (90 Base) MCG/ACT inhaler Commonly known as: VENTOLIN  HFA Inhale 2 puffs into the lungs every 6 (six) hours as needed for wheezing or shortness of breath.   amLODipine  5 MG tablet Commonly known as: NORVASC  Take 1 tablet (5 mg total) by mouth daily.   atorvastatin  10 MG tablet Commonly known as: LIPITOR Take 1 tablet (10 mg total) by mouth daily.   blood glucose meter kit and supplies Dispense based on patient and insurance preference. Use up to four times daily as directed. (FOR ICD-10 E10.9, E11.9).   empagliflozin  25 MG Tabs tablet Commonly known as: Jardiance  Take 1 tablet (25 mg total) by mouth daily before breakfast.   glimepiride  2 MG tablet Commonly known as: AMARYL  Take 1 tablet (2 mg total) by mouth daily before breakfast.   insulin  glargine 100 UNIT/ML Solostar Pen Commonly known as: LANTUS  Inject 10 Units into the skin daily.   lisinopril  20 MG tablet Commonly known as: ZESTRIL  Take 1 tablet (20 mg total) by mouth daily.   loratadine  10 MG tablet Commonly known as: CLARITIN  Take 1 tablet (10 mg total) by mouth daily.   sodium fluoride  1.1 % Crea dental cream Commonly known as: Denta 5000 Plus Place 1 Application  onto teeth at bedtime.   Vitamin D  (Ergocalciferol ) 1.25 MG (50000 UNIT) Caps capsule Commonly known as: DRISDOL  Take 1 capsule (50,000 Units total) by mouth every 7 (seven) days. E55.9        Review of Systems:  Review of Systems  Constitutional:  Negative for appetite change and fatigue.  HENT:  Negative for congestion and trouble swallowing.   Eyes:  Negative for visual disturbance.  Respiratory:  Negative for cough and wheezing.   Cardiovascular:  Negative for chest pain, palpitations and leg swelling.  Gastrointestinal:  Negative for abdominal pain and constipation.  Genitourinary:  Negative for  dysuria and urgency.  Musculoskeletal:  Negative for gait problem.  Skin:  Negative for color change.  Neurological:  Negative for speech difficulty, weakness and headaches.  Psychiatric/Behavioral:  Negative for confusion and sleep disturbance. The patient is not nervous/anxious.     Health Maintenance  Topic Date Due  . OPHTHALMOLOGY EXAM  Never done  . Colonoscopy  Never done  . FOOT EXAM  07/30/2023  . COVID-19 Vaccine (1 - 2024-25 season) 06/29/2024 (Originally 08/02/2023)  . Hepatitis B Vaccines (1 of 3 - 19+ 3-dose series) 06/13/2025 (Originally 12/02/1995)  . INFLUENZA VACCINE  07/01/2024  . HEMOGLOBIN A1C  12/07/2024  . Diabetic kidney evaluation - Urine ACR  01/31/2025  . Diabetic kidney evaluation - eGFR measurement  06/06/2025  . Cervical Cancer Screening (HPV/Pap Cotest)  03/31/2026  . DTaP/Tdap/Td (2 - Tdap) 01/27/2033  . Pneumococcal Vaccine 73-66 Years old  Completed  . Hepatitis C Screening  Completed  . HIV Screening  Completed  . HPV VACCINES  Aged Out  . Meningococcal B Vaccine  Aged Out    Physical Exam: Vitals:   06/13/24 1049  BP: 138/80  Pulse: (!) 58  Resp: 20  Temp: 97.6 F (36.4 C)  SpO2: 99%  Weight: 177 lb 9.6 oz (80.6 kg)  Height: 5' (1.524 m)   Body mass index is 34.69 kg/m. Physical Exam Vitals and nursing note reviewed.  Constitutional:      Appearance: Normal appearance.  HENT:     Head: Normocephalic and atraumatic.     Nose: Nose normal.     Mouth/Throat:     Mouth: Mucous membranes are moist.  Eyes:     Extraocular Movements: Extraocular movements intact.     Conjunctiva/sclera: Conjunctivae normal.     Pupils: Pupils are equal, round, and reactive to light.  Cardiovascular:     Rate and Rhythm: Normal rate and regular rhythm.     Heart sounds: No murmur heard. Pulmonary:     Effort: Pulmonary effort is normal.     Breath sounds: No wheezing, rhonchi or rales.  Abdominal:     General: Bowel sounds are normal.      Palpations: Abdomen is soft.     Tenderness: There is no abdominal tenderness.  Musculoskeletal:        General: No tenderness. Normal range of motion.     Cervical back: Normal range of motion and neck supple.     Right lower leg: No edema.     Left lower leg: No edema.  Skin:    General: Skin is warm and dry.     Findings: No rash.  Neurological:     General: No focal deficit present.     Mental Status: She is alert and oriented to person, place, and time. Mental status is at baseline.     Motor: No weakness.     Coordination:  Coordination normal.     Gait: Gait normal.  Psychiatric:        Mood and Affect: Mood normal.        Behavior: Behavior normal.        Thought Content: Thought content normal.        Judgment: Judgment normal.     Labs reviewed: Basic Metabolic Panel: Recent Labs    07/03/23 1120 01/04/24 1220 01/06/24 1547 06/06/24 1100  NA 136 137  --  135  K 4.7 4.5  --  4.2  CL 103 103  --  103  CO2 28 26  --  24  GLUCOSE 373* 234*  --  224*  BUN 11 8  --  9  CREATININE 0.87 0.81  --  0.69  CALCIUM  11.4* 11.0* 10.9* 10.5*  TSH 1.99 2.91  --  2.50   Liver Function Tests: Recent Labs    07/03/23 1120 01/04/24 1220 06/06/24 1100  AST 12 15 19   ALT 15 11 16   BILITOT 0.4 0.4 0.4  PROT 7.8 7.5 7.4   No results for input(s): LIPASE, AMYLASE in the last 8760 hours. No results for input(s): AMMONIA in the last 8760 hours. CBC: Recent Labs    07/03/23 1120 01/04/24 1220 06/06/24 1100  WBC 3.4* 3.3* 2.9*  NEUTROABS 1,465* 1,581 1,279*  HGB 14.6 14.8 13.5  HCT 43.3 44.4 41.6  MCV 86.4 87.7 88.1  PLT 211 210 199   Lipid Panel: Recent Labs    07/03/23 1120 01/04/24 1220 06/06/24 1100  CHOL 182 184 175  HDL 50 49* 49*  LDLCALC 114* 117* 110*  TRIG 83 84 70  CHOLHDL 3.6 3.8 3.6   Lab Results  Component Value Date   HGBA1C 12.4 (H) 06/06/2024    Procedures since last visit: No results found.  Assessment/Plan Assessment and  Plan Assessment & Plan  Type 2 diabetes mellitus without complication, without long-term current use of insulin  (HCC) The patient is not taking meds, education provided on Glimepiride , Jardiance ,  insulin , Hgb A1c 12.4 06/06/24 F/u Hgb A1c 4 months.   Hyperlipidemia LDL goal <100 on Atorvastatin , LDL 110 06/06/24  Essential hypertension Blood pressure is controlled, on Amlodipine , Lisinopril , Bun/creat 9/0.69 06/06/24       Labs/tests ordered:  Hgb A1c 4 months.  Next appt:  4 months

## 2024-06-13 ENCOUNTER — Ambulatory Visit (INDEPENDENT_AMBULATORY_CARE_PROVIDER_SITE_OTHER): Payer: Self-pay | Admitting: Nurse Practitioner

## 2024-06-13 ENCOUNTER — Encounter: Payer: Self-pay | Admitting: Nurse Practitioner

## 2024-06-13 VITALS — BP 138/80 | HR 58 | Temp 97.6°F | Resp 20 | Ht 60.0 in | Wt 177.6 lb

## 2024-06-13 DIAGNOSIS — E119 Type 2 diabetes mellitus without complications: Secondary | ICD-10-CM | POA: Diagnosis not present

## 2024-06-13 DIAGNOSIS — E785 Hyperlipidemia, unspecified: Secondary | ICD-10-CM | POA: Diagnosis not present

## 2024-06-13 DIAGNOSIS — I10 Essential (primary) hypertension: Secondary | ICD-10-CM

## 2024-06-13 NOTE — Assessment & Plan Note (Signed)
 on Atorvastatin , LDL 110 06/06/24

## 2024-06-13 NOTE — Assessment & Plan Note (Signed)
 Blood pressure is controlled, on Amlodipine , Lisinopril , Bun/creat 9/0.69 06/06/24

## 2024-06-13 NOTE — Assessment & Plan Note (Addendum)
 The patient is not taking meds, education provided on Glimepiride , Jardiance ,  insulin , Hgb A1c 12.4 06/06/24 F/u Hgb A1c 4 months.

## 2024-06-16 ENCOUNTER — Telehealth: Payer: Self-pay | Admitting: Family

## 2024-06-16 DIAGNOSIS — E1169 Type 2 diabetes mellitus with other specified complication: Secondary | ICD-10-CM

## 2024-06-16 MED ORDER — EMPAGLIFLOZIN 25 MG PO TABS: 25.0000 mg | ORAL_TABLET | Freq: Every day | ORAL | 1 refills | Status: AC

## 2024-06-16 NOTE — Telephone Encounter (Signed)
 Copied from CRM [id number]. Topic: Clinical - Medication Refill >> Jun 16, 2024  1:40 PM Carrielelia G wrote: Medication: empagliflozin  (JARDIANCE ) 25 MG TABS tablet  Has the patient contacted their pharmacy? Yes (Agent: If no, request that the patient contact the pharmacy for the refill. If patient does not wish to contact the pharmacy document the reason why and proceed with request.) (Agent: If yes, when and what did the pharmacy advise?)  This is the patient's preferred pharmacy:  [id number] - [place], [place] - [address] AT [facility] [address] [place] [address] Phone: [phone] Fax: [phone]  Is this the correct pharmacy for this prescription? Yes If no, delete pharmacy and type the correct one.    Is the patient out of the medication? Yes  Has the patient been seen for an appointment in the last year OR does the patient have an upcoming appointment? Yes  Can we respond through MyChart? No  Agent: Please be advised that Rx refills may take up to 3 business days. We ask that you follow-up with your pharmacy.

## 2024-08-03 ENCOUNTER — Other Ambulatory Visit: Payer: Self-pay

## 2024-08-03 DIAGNOSIS — E1169 Type 2 diabetes mellitus with other specified complication: Secondary | ICD-10-CM

## 2024-08-03 DIAGNOSIS — I10 Essential (primary) hypertension: Secondary | ICD-10-CM

## 2024-08-03 MED ORDER — AMLODIPINE BESYLATE 5 MG PO TABS: 5.0000 mg | ORAL_TABLET | Freq: Every day | ORAL | 1 refills | Status: DC

## 2024-08-03 MED ORDER — GLIMEPIRIDE 2 MG PO TABS: 2.0000 mg | ORAL_TABLET | Freq: Every day | ORAL | 1 refills | Status: DC

## 2024-10-13 ENCOUNTER — Encounter: Admitting: Family

## 2024-10-14 ENCOUNTER — Ambulatory Visit: Payer: Self-pay | Admitting: Family

## 2024-10-23 NOTE — Progress Notes (Signed)
   This encounter was created in error - please disregard. No show

## 2024-11-07 ENCOUNTER — Ambulatory Visit: Payer: Self-pay | Admitting: *Deleted

## 2024-11-07 ENCOUNTER — Telehealth: Payer: Self-pay | Admitting: Family

## 2024-11-07 DIAGNOSIS — I10 Essential (primary) hypertension: Secondary | ICD-10-CM

## 2024-11-07 DIAGNOSIS — E1169 Type 2 diabetes mellitus with other specified complication: Secondary | ICD-10-CM

## 2024-11-07 NOTE — Telephone Encounter (Unsigned)
 Copied from CRM #[id number]. Topic: Clinical - Medication Refill >> Nov 07, 2024  9:54 AM Antonio H wrote: Medication: glimepiride  (AMARYL ) 2 MG tablet amLODipine  (NORVASC ) 5 MG tablet  Has the patient contacted their pharmacy? No (Agent: If no, request that the patient contact the pharmacy for the refill. If patient does not wish to contact the pharmacy document the reason why and proceed with request.) (Agent: If yes, when and what did the pharmacy advise?)  This is the patient's preferred pharmacy:  [id number] - [place] - [address] AT [facility] [address]  [place] [address] Phone: [phone] Fax: [phone]  Is this the correct pharmacy for this prescription? Yes If no, delete pharmacy and type the correct one.   Has the prescription been filled recently? No  Is the patient out of the medication? Yes  Has the patient been seen for an appointment in the last year OR does the patient have an upcoming appointment? Yes  Can we respond through MyChart? No  Agent: Please be advised that Rx refills may take up to 3 business days. We ask that you follow-up with your pharmacy.

## 2024-11-07 NOTE — Telephone Encounter (Signed)
 FYI Only or Action Required?: FYI only for provider: appointment scheduled on 11/08/24.  Patient was last seen in primary care on 06/13/2024 by Mast, Man X, NP.  Called Nurse Triage reporting Cough. Headache, sore throat, left wrist pain swelling   Symptoms began several weeks ago.  Interventions attempted: OTC medications: cough drops, cold/ flu medication , Prescription medications: inhaler, and Rest, hydration, or home remedies.  Symptoms are: gradually worsening.  Triage Disposition: See Physician Within 24 Hours  Patient/caregiver understands and will follow disposition?: Yes               Copied from CRM #[id number]. Topic: Clinical - Red Word Triage >> Nov 07, 2024  9:51 AM Antonio DEL wrote: Red Word that prompted transfer to Nurse Triage: Patient's son Ray is calling, patient is present. Patient has some swelling in her left wrist, feels some pain when using wrist. Thinks it's due to work. Has been going on for about a week and a half. Also experiencing a cough that has been going on for about 2 weeks and has a headache too.  Patient is due for 4 month follow up as well. Reason for Disposition  [1] Continuous (nonstop) coughing interferes with work or school AND [2] no improvement using cough treatment per Care Advice  Answer Assessment - Initial Assessment Questions Appt scheduled for tomorrow with PCP. Son Ray, not listed on DPR with patient and answering questions. HIPAA precautions followed.      1. ONSET: When did the cough begin?      2 weeks ago  2. SEVERITY: How bad is the cough today?      Mild  3. SPUTUM: Describe the color of your sputum (e.g., none, dry cough; clear, white, yellow, green)     None  4. HEMOPTYSIS: Are you coughing up any blood? If Yes, ask: How much? (e.g., flecks, streaks, tablespoons, etc.)     na 5. DIFFICULTY BREATHING: Are you having difficulty breathing? If Yes, ask: How bad is it? (e.g., mild, moderate, severe)       None  6. FEVER: Do you have a fever? If Yes, ask: What is your temperature, how was it measured, and when did it start?     no 7. CARDIAC HISTORY: Do you have any history of heart disease? (e.g., heart attack, congestive heart failure)      na 8. LUNG HISTORY: Do you have any history of lung disease?  (e.g., pulmonary embolus, asthma, emphysema)     Hx asthma  9. PE RISK FACTORS: Do you have a history of blood clots? (or: recent major surgery, recent prolonged travel, bedridden)     na 10. OTHER SYMPTOMS: Do you have any other symptoms? (e.g., runny nose, wheezing, chest pain)       Headache, cough dry, using inhaler, sore throat  no fever, no sleeping due to coughing. Denies chest pain no difficulty breathing. Left wrist pain swelling, unable to lift heavier objects and feels it is due to work. Mild pain but not getting better.  11. PREGNANCY: Is there any chance you are pregnant? When was your last menstrual period?       na 12. TRAVEL: Have you traveled out of the country in the last month? (e.g., travel history, exposures)       na  Protocols used: Cough - Acute Non-Productive-A-AH

## 2024-11-07 NOTE — Telephone Encounter (Signed)
 Appointment scheduled 11/08/2024

## 2024-11-08 ENCOUNTER — Ambulatory Visit: Admitting: Family

## 2024-11-08 ENCOUNTER — Encounter: Payer: Self-pay | Admitting: Family

## 2024-11-08 VITALS — BP 124/78 | HR 60 | Temp 96.8°F | Resp 18 | Ht 60.0 in | Wt 179.8 lb

## 2024-11-08 DIAGNOSIS — I10 Essential (primary) hypertension: Secondary | ICD-10-CM

## 2024-11-08 DIAGNOSIS — E1169 Type 2 diabetes mellitus with other specified complication: Secondary | ICD-10-CM

## 2024-11-08 DIAGNOSIS — R6889 Other general symptoms and signs: Secondary | ICD-10-CM

## 2024-11-08 DIAGNOSIS — J029 Acute pharyngitis, unspecified: Secondary | ICD-10-CM

## 2024-11-08 DIAGNOSIS — E559 Vitamin D deficiency, unspecified: Secondary | ICD-10-CM

## 2024-11-08 DIAGNOSIS — N912 Amenorrhea, unspecified: Secondary | ICD-10-CM

## 2024-11-08 MED ORDER — VITAMIN D (ERGOCALCIFEROL) 1.25 MG (50000 UNIT) PO CAPS: 50000.0000 [IU] | ORAL_CAPSULE | ORAL | 1 refills | Status: AC

## 2024-11-08 MED ORDER — INSULIN GLARGINE 100 UNIT/ML SOLOSTAR PEN: 10.0000 [IU] | PEN_INJECTOR | Freq: Every day | SUBCUTANEOUS | 11 refills | Status: AC

## 2024-11-08 MED ORDER — LISINOPRIL 20 MG PO TABS: 20.0000 mg | ORAL_TABLET | Freq: Every day | ORAL | 1 refills | Status: AC

## 2024-11-08 MED ORDER — GLIMEPIRIDE 2 MG PO TABS: 2.0000 mg | ORAL_TABLET | Freq: Every day | ORAL | 1 refills | Status: AC

## 2024-11-08 MED ORDER — LORATADINE 10 MG PO TABS: 10.0000 mg | ORAL_TABLET | Freq: Every day | ORAL | 1 refills | Status: AC

## 2024-11-08 MED ORDER — AMLODIPINE BESYLATE 5 MG PO TABS: 5.0000 mg | ORAL_TABLET | Freq: Every day | ORAL | 1 refills | Status: AC

## 2024-11-08 NOTE — Progress Notes (Signed)
 Provider: Roxan Plough FNP-C  Zeriah Baysinger, Roxan BROCKS, NP  Patient Care Team: Chinenye Katzenberger, Roxan BROCKS, NP as PCP - General (Family Medicine) [facility], [facility]  Extended Emergency Contact Information Primary Emergency Contact: Pampo,Joseph Address: [address]          [place], [place] [address] [place] Home Phone: [phone] Relation: Spouse Secondary Emergency Contact: Anice India morita, [place] Home Phone: [phone] Relation: Relative  Code Status: Full Code  Goals of care: Advanced Directive information    06/13/2024   10:44 AM  Advanced Directives  Does Patient Have a Medical Advance Directive? No  Would patient like information on creating a medical advance directive? No - Patient declined     Chief Complaint  Patient presents with   Cough    Patient's son Ray is calling, patient is present. Patient has some swelling in her left wrist, feels some pain when using wrist. Thinks it's due to work. Has been going on for about a week and a half. Also experiencing a cough that has been going on for about 2 weeks and has a headache too.     HPI:  Pt is a 48 y.o. female seen today for an acute visit for evaluation of cough x 2 weeks.Describes cough as non-productive.Has had headache due to coughing.Associated with runny nose and sore throat for the past few days but had improved.She denies any fever,chills,fatigue,body aches,chest tightness,chest pain,palpitation or shortness of breath.  She complains of no menstrual period since delivery of her last born.Had tubal ligation after delivery.she request referral to gynecologist.  Past Medical History:  Diagnosis Date   Diabetes mellitus without complication (HCC)    Hyperlipidemia    Hypertension    No pertinent past medical history    Past Surgical History:  Procedure Laterality Date   CESAREAN SECTION N/A 08/22/2014   Procedure: CESAREAN SECTION;  Surgeon: Donna Just, DO;  Location: [facility];  Service: Obstetrics;  Laterality: N/A;, for breech presentation.   TUBAL LIGATION Bilateral 08/22/2014   Procedure: BILATERAL TUBAL LIGATION;  Surgeon: Donna Just, DO;  Location: [facility];  Service: Obstetrics;  Laterality: Bilateral;   WISDOM TOOTH EXTRACTION      No Known Allergies  Outpatient Encounter Medications as of 11/08/2024  Medication Sig   acetaminophen  (TYLENOL ) 500 MG tablet Take 500 mg by mouth every 6 (six) hours as needed for mild pain or headache.   albuterol  (VENTOLIN  HFA) 108 (90 Base) MCG/ACT inhaler Inhale 2 puffs into the lungs every 6 (six) hours as needed for wheezing or shortness of breath.   atorvastatin  (LIPITOR) 10 MG tablet Take 1 tablet (10 mg total) by mouth daily.   empagliflozin  (JARDIANCE ) 25 MG TABS tablet Take 1 tablet (25 mg total) by mouth daily before breakfast.   [DISCONTINUED] amLODipine  (NORVASC ) 5 MG tablet Take 1 tablet (5 mg total) by mouth daily.   [DISCONTINUED] glimepiride  (AMARYL ) 2 MG tablet Take 1 tablet (2 mg total) by mouth daily before breakfast.   Accu-Chek Softclix Lancets lancets Check blood glucose twice daily before meals. (Patient not taking: Reported on 11/08/2024)   blood glucose meter kit and supplies Dispense based on patient and insurance preference. Use up to four times daily as directed. (FOR ICD-10 E10.9, E11.9). (Patient not taking: Reported on 11/08/2024)   Blood Glucose Monitoring Suppl (ACCU-CHEK AVIVA PLUS) w/Device KIT Check blood glucose twice daily before meals (Patient not taking:  Reported on 11/08/2024)   glucose blood (ACCU-CHEK AVIVA PLUS) test strip Check blood glucose twice daily before meals (Patient not taking: Reported on 11/08/2024)   insulin  glargine (LANTUS ) 100 UNIT/ML Solostar Pen Inject 10 Units into the skin daily.   lisinopril  (ZESTRIL ) 20 MG tablet Take 1 tablet (20 mg total) by mouth daily.   loratadine  (CLARITIN ) 10 MG tablet Take 1 tablet (10 mg total) by mouth daily.    sodium fluoride  (DENTA 5000 PLUS) 1.1 % CREA dental cream Place 1 Application onto teeth at bedtime. (Patient not taking: Reported on 11/08/2024)   Vitamin D , Ergocalciferol , (DRISDOL ) 1.25 MG (50000 UNIT) CAPS capsule Take 1 capsule (50,000 Units total) by mouth every 7 (seven) days. E55.9   [DISCONTINUED] insulin  glargine (LANTUS ) 100 UNIT/ML Solostar Pen Inject 10 Units into the skin daily. (Patient not taking: Reported on 11/08/2024)   [DISCONTINUED] lisinopril  (ZESTRIL ) 20 MG tablet Take 1 tablet (20 mg total) by mouth daily. (Patient not taking: Reported on 11/08/2024)   [DISCONTINUED] loratadine  (CLARITIN ) 10 MG tablet Take 1 tablet (10 mg total) by mouth daily. (Patient not taking: Reported on 11/08/2024)   [DISCONTINUED] Vitamin D , Ergocalciferol , (DRISDOL ) 1.25 MG (50000 UNIT) CAPS capsule Take 1 capsule (50,000 Units total) by mouth every 7 (seven) days. E55.9 (Patient not taking: Reported on 11/08/2024)   No facility-administered encounter medications on file as of 11/08/2024.    Review of Systems  Constitutional:  Negative for appetite change, chills, fatigue, fever and unexpected weight change.  HENT:  Positive for rhinorrhea. Negative for congestion, dental problem, ear discharge, ear pain, facial swelling, hearing loss, nosebleeds, postnasal drip, sinus pressure, sinus pain, sneezing, sore throat, tinnitus and trouble swallowing.   Eyes:  Negative for pain, discharge, redness, itching and visual disturbance.  Respiratory:  Positive for cough. Negative for chest tightness, shortness of breath and wheezing.   Cardiovascular:  Negative for chest pain, palpitations and leg swelling.  Gastrointestinal:  Negative for abdominal distention, abdominal pain, constipation, diarrhea, nausea and vomiting.  Genitourinary:  Positive for menstrual problem. Negative for difficulty urinating, dysuria, flank pain, frequency and urgency.  Musculoskeletal:  Negative for arthralgias, back pain, gait  problem, joint swelling and myalgias.  Skin:  Negative for color change, pallor and rash.  Neurological:  Negative for dizziness, syncope, speech difficulty, weakness, light-headedness, numbness and headaches.    Immunization History  Administered Date(s) Administered   Influenza, Seasonal, Injecte, Preservative Fre 01/04/2024   Influenza,inj,Quad PF,6+ Mos 08/24/2014   PNEUMOCOCCAL CONJUGATE-20 01/04/2024   Pneumococcal Polysaccharide-23 08/17/2019   Td 01/27/2023   Pertinent  Health Maintenance Due  Topic Date Due   OPHTHALMOLOGY EXAM  Never done   Mammogram  Never done   Colonoscopy  Never done   FOOT EXAM  07/30/2023   Influenza Vaccine  07/01/2024   HEMOGLOBIN A1C  12/07/2024      03/24/2022    2:54 PM 07/29/2022   10:03 AM 03/04/2023    9:34 AM 01/04/2024   11:01 AM 06/13/2024   10:44 AM  Fall Risk  Falls in the past year? 0 0 0 0 0  Was there an injury with Fall?   0  0  0   Fall Risk Category Calculator   0 0 0  (RETIRED) Patient Fall Risk Level Low fall risk  Low fall risk      Patient at Risk for Falls Due to   No Fall Risks  No Fall Risks  Fall risk Follow up   Falls evaluation completed  Falls evaluation completed     Data saved with a previous flowsheet row definition   Functional Status Survey:    Vitals:   11/08/24 1122  BP: 124/78  Pulse: 60  Resp: 18  Temp: (!) 96.8 F (36 C)  SpO2: 98%  Weight: 179 lb 12.8 oz (81.6 kg)  Height: 5' (1.524 m)   Body mass index is 35.11 kg/m. Physical Exam Vitals reviewed.  Constitutional:      General: She is not in acute distress.    Appearance: Normal appearance. She is normal weight. She is not ill-appearing or diaphoretic.  HENT:     Head: Normocephalic.     Right Ear: Tympanic membrane, ear canal and external ear normal. There is no impacted cerumen.     Left Ear: Tympanic membrane, ear canal and external ear normal. There is no impacted cerumen.     Nose: Nose normal. No congestion or rhinorrhea.      Mouth/Throat:     Mouth: Mucous membranes are moist.     Pharynx: Oropharynx is clear. No oropharyngeal exudate or posterior oropharyngeal erythema.  Eyes:     General: No scleral icterus.       Right eye: No discharge.        Left eye: No discharge.     Extraocular Movements: Extraocular movements intact.     Conjunctiva/sclera: Conjunctivae normal.     Pupils: Pupils are equal, round, and reactive to light.  Neck:     Vascular: No carotid bruit.  Cardiovascular:     Rate and Rhythm: Normal rate and regular rhythm.     Pulses: Normal pulses.     Heart sounds: Normal heart sounds. No murmur heard.    No friction rub. No gallop.  Pulmonary:     Effort: Pulmonary effort is normal. No respiratory distress.     Breath sounds: Examination of the left-middle field reveals rales. Examination of the left-lower field reveals rales. Rales present. No wheezing or rhonchi.  Chest:     Chest wall: No tenderness.  Abdominal:     General: Bowel sounds are normal. There is no distension.     Palpations: Abdomen is soft. There is no mass.     Tenderness: There is no abdominal tenderness. There is no right CVA tenderness, left CVA tenderness, guarding or rebound.  Musculoskeletal:        General: No swelling or tenderness. Normal range of motion.     Cervical back: Normal range of motion. No rigidity or tenderness.     Right lower leg: No edema.     Left lower leg: No edema.  Lymphadenopathy:     Cervical: No cervical adenopathy.  Skin:    General: Skin is warm and dry.     Coloration: Skin is not pale.     Findings: No bruising, erythema, lesion or rash.  Neurological:     Mental Status: She is alert and oriented to person, place, and time.     Cranial Nerves: No cranial nerve deficit.     Sensory: No sensory deficit.     Motor: No weakness.     Coordination: Coordination normal.     Gait: Gait normal.  Psychiatric:        Mood and Affect: Mood normal.        Speech: Speech normal.         Behavior: Behavior normal.        Thought Content: Thought content normal.  Judgment: Judgment normal.    Labs reviewed: Recent Labs    01/04/24 1220 01/06/24 1547 06/06/24 1100  NA 137  --  135  K 4.5  --  4.2  CL 103  --  103  CO2 26  --  24  GLUCOSE 234*  --  224*  BUN 8  --  9  CREATININE 0.81  --  0.69  CALCIUM  11.0* 10.9* 10.5*   Recent Labs    01/04/24 1220 06/06/24 1100  AST 15 19  ALT 11 16  BILITOT 0.4 0.4  PROT 7.5 7.4   Recent Labs    01/04/24 1220 06/06/24 1100  WBC 3.3* 2.9*  NEUTROABS 1,581 1,279*  HGB 14.8 13.5  HCT 44.4 41.6  MCV 87.7 88.1  PLT 210 199   Lab Results  Component Value Date   TSH 2.50 06/06/2024   Lab Results  Component Value Date   HGBA1C 12.4 (H) 06/06/2024   Lab Results  Component Value Date   CHOL 175 06/06/2024   HDL 49 (L) 06/06/2024   LDLCALC 110 (H) 06/06/2024   TRIG 70 06/06/2024   CHOLHDL 3.6 06/06/2024    Significant Diagnostic Results in last 30 days:  No results found.  Assessment/Plan 1. Flu-like symptoms (Primary) Afebrile  - POCT Influenza A/B results negative  - POCT rapid strep A negative  - SARS-COV-2 RNA,(COVID-19) QUAL NAAT  2. Sore throat  Oropharyngeal clear on exam - POCT Influenza A/B results negative  - POCT rapid strep A negative  - SARS-COV-2 RNA,(COVID-19) QUAL NAAT  3. Type 2 diabetes mellitus with hyperlipidemia (HCC) Lab Results  Component Value Date   HGBA1C 12.4 (H) 06/06/2024  No home CBG log for evaluation.Has run out of Lantus  will recheck lab work. - dietary modification and exercise advised  - insulin  glargine (LANTUS ) 100 UNIT/ML Solostar Pen; Inject 10 Units into the skin daily.  Dispense: 15 mL; Refill: 11 - lisinopril  (ZESTRIL ) 20 MG tablet; Take 1 tablet (20 mg total) by mouth daily.  Dispense: 90 tablet; Refill: 1 - Lipid panel - TSH - COMPLETE METABOLIC PANEL WITHOUT GFR - CBC with Differential/Platelet - Hemoglobin A1c  4. Vitamin D   deficiency Previous level 21  - continue on vit D supplement  - Vitamin D , Ergocalciferol , (DRISDOL ) 1.25 MG (50000 UNIT) CAPS capsule; Take 1 capsule (50,000 Units total) by mouth every 7 (seven) days. E55.9  Dispense: 12 capsule; Refill: 1  5. Essential hypertension B/p well controlled  - continue dietary modification and exercise at least three times per week for 30 minutes.  - continue current medication.unclear if taking Lisinopril   - lisinopril  (ZESTRIL ) 20 MG tablet; Take 1 tablet (20 mg total) by mouth daily.  Dispense: 90 tablet; Refill: 1 - Lipid panel - TSH - COMPLETE METABOLIC PANEL WITHOUT GFR - CBC with Differential/Platelet  6. Amenorrhea Ongoing since delivery of her last born.status post tubal ligation 2015 Suspect possible pre-menopausal/menopausal  - Ambulatory referral to Gynecology  Family/ staff Communication: Reviewed plan of care with patient and daughter verbalized understanding   Labs/tests ordered:   - Lipid panel - TSH - COMPLETE METABOLIC PANEL WITHOUT GFR - CBC with Differential/Platelet - Hemoglobin A1c - POCT Influenza A/B  - POCT rapid strep A - SARS-COV-2 RNA,(COVID-19) QUAL NAAT  Next Appointment: Return in about 4 months (around 03/09/2025) for medical mangement of chronic issues..   Total time: 30 minutes. Greater than 50% of total time spent doing patient education regarding Flu-like symptoms,sore throat,T2DM,HTN, ,Vitamin D  deficiency,Amenorrhea,health maintenance  including symptom/medication management.   Roxan JAYSON Plough, NP

## 2024-11-08 NOTE — Progress Notes (Unsigned)
cov

## 2024-11-09 LAB — POCT INFLUENZA A/B
Influenza A, POC: NEGATIVE
Influenza B, POC: NEGATIVE

## 2024-11-09 LAB — POCT RAPID STREP A (OFFICE): Rapid Strep A Screen: NEGATIVE

## 2024-11-10 ENCOUNTER — Ambulatory Visit: Payer: Self-pay | Admitting: Family

## 2024-11-10 LAB — SARS-COV-2 RNA,(COVID-19) QUALITATIVE NAAT: SARS CoV2 RNA: NOT DETECTED

## 2024-11-11 ENCOUNTER — Telehealth: Payer: Self-pay

## 2024-11-11 LAB — TEST AUTHORIZATION

## 2024-11-11 LAB — LIPID PANEL
Cholesterol: 165 mg/dL (ref <200)
HDL: 48 mg/dL — ABNORMAL LOW (ref >=50)
LDL Cholesterol (Calc): 98 mg/dL
Non-HDL Cholesterol (Calc): 117 mg/dL (ref <130)
Total CHOL/HDL Ratio: 3.4 (calc) (ref <5.0)
Triglycerides: 99 mg/dL (ref <150)

## 2024-11-11 LAB — CBC WITH DIFFERENTIAL/PLATELET
Absolute Lymphocytes: 1754 {cells}/uL (ref 850–3900)
Absolute Monocytes: 538 {cells}/uL (ref 200–950)
Basophils Absolute: 22 {cells}/uL (ref 0–200)
Basophils Relative: 0.5 %
Eosinophils Absolute: 112 {cells}/uL (ref 15–500)
Eosinophils Relative: 2.6 %
HCT: 39.6 % (ref 35.9–46.0)
Hemoglobin: 13.3 g/dL (ref 11.7–15.5)
MCH: 30 pg (ref 27.0–33.0)
MCHC: 33.6 g/dL (ref 31.6–35.4)
MCV: 89.4 fL (ref 81.4–101.7)
MPV: 11 fL (ref 7.5–12.5)
Monocytes Relative: 12.5 %
Neutro Abs: 1875 {cells}/uL (ref 1500–7800)
Neutrophils Relative %: 43.6 %
Platelets: 224 Thousand/uL (ref 140–400)
RBC: 4.43 Million/uL (ref 3.80–5.10)
RDW: 13.2 % (ref 11.0–15.0)
Total Lymphocyte: 40.8 %
WBC: 4.3 Thousand/uL (ref 3.8–10.8)

## 2024-11-11 LAB — COMPLETE METABOLIC PANEL WITHOUT GFR
AG Ratio: 1.1 (calc) (ref 1.0–2.5)
ALT: 15 U/L (ref 6–29)
AST: 19 U/L (ref 10–35)
Albumin: 3.9 g/dL (ref 3.6–5.1)
Alkaline phosphatase (APISO): 102 U/L (ref 31–125)
BUN: 7 mg/dL (ref 7–25)
CO2: 32 mmol/L (ref 20–32)
Calcium: 10.4 mg/dL — ABNORMAL HIGH (ref 8.6–10.2)
Chloride: 101 mmol/L (ref 98–110)
Creat: 0.71 mg/dL (ref 0.50–0.99)
Globulin: 3.6 g/dL (ref 1.9–3.7)
Glucose, Bld: 210 mg/dL — ABNORMAL HIGH (ref 65–99)
Potassium: 4.4 mmol/L (ref 3.5–5.3)
Sodium: 136 mmol/L (ref 135–146)
Total Bilirubin: 0.3 mg/dL (ref 0.2–1.2)
Total Protein: 7.5 g/dL (ref 6.1–8.1)

## 2024-11-11 LAB — HEMOGLOBIN A1C
Hgb A1c MFr Bld: 11.5 % — ABNORMAL HIGH (ref <5.7)
Mean Plasma Glucose: 283 mg/dL
eAG (mmol/L): 15.7 mmol/L

## 2024-11-11 LAB — TSH: TSH: 3 m[IU]/L

## 2024-11-11 LAB — PTH, INTACT AND CALCIUM

## 2024-11-11 NOTE — Telephone Encounter (Signed)
 Outgoing call placed via [employer]rup[employer] services ([phone]), per interrupter phone number is not in service. We will need to make future attempts to reach patient

## 2024-11-11 NOTE — Telephone Encounter (Signed)
 Please schedule for lab appointment to check PTH

## 2024-11-11 NOTE — Telephone Encounter (Signed)
 Per Landry, [facility] lab tech, unable to add process PTH   Please advise

## 2024-11-15 NOTE — Telephone Encounter (Signed)
 See recent labs.

## 2025-03-10 ENCOUNTER — Ambulatory Visit: Admitting: Family
